# Patient Record
Sex: Female | Born: 1945 | Race: White | Hispanic: No | Smoking: Former smoker
Health system: Southern US, Community
[De-identification: ages and names within clinical notes are randomized; demographics above are authoritative.]

## PROBLEM LIST (undated history)

## (undated) DIAGNOSIS — Z923 Personal history of irradiation: Secondary | ICD-10-CM

## (undated) HISTORY — PX: PARATHYROIDECTOMY: SHX19

---

## 2005-09-24 DIAGNOSIS — C50919 Malignant neoplasm of unspecified site of unspecified female breast: Secondary | ICD-10-CM

## 2005-09-24 HISTORY — PX: BREAST LUMPECTOMY: SHX2

## 2005-09-24 HISTORY — DX: Malignant neoplasm of unspecified site of unspecified female breast: C50.919

## 2006-09-24 DIAGNOSIS — Z923 Personal history of irradiation: Secondary | ICD-10-CM

## 2006-09-24 HISTORY — DX: Personal history of irradiation: Z92.3

## 2011-09-28 DIAGNOSIS — D485 Neoplasm of uncertain behavior of skin: Secondary | ICD-10-CM | POA: Diagnosis not present

## 2011-09-28 DIAGNOSIS — C44319 Basal cell carcinoma of skin of other parts of face: Secondary | ICD-10-CM | POA: Diagnosis not present

## 2011-10-05 DIAGNOSIS — Z23 Encounter for immunization: Secondary | ICD-10-CM | POA: Diagnosis not present

## 2011-10-05 DIAGNOSIS — L989 Disorder of the skin and subcutaneous tissue, unspecified: Secondary | ICD-10-CM | POA: Diagnosis not present

## 2011-10-05 DIAGNOSIS — I10 Essential (primary) hypertension: Secondary | ICD-10-CM | POA: Diagnosis not present

## 2011-10-09 DIAGNOSIS — C44319 Basal cell carcinoma of skin of other parts of face: Secondary | ICD-10-CM | POA: Diagnosis not present

## 2011-10-09 DIAGNOSIS — I10 Essential (primary) hypertension: Secondary | ICD-10-CM | POA: Diagnosis not present

## 2011-10-09 DIAGNOSIS — R6889 Other general symptoms and signs: Secondary | ICD-10-CM | POA: Diagnosis not present

## 2011-11-07 DIAGNOSIS — L570 Actinic keratosis: Secondary | ICD-10-CM | POA: Diagnosis not present

## 2011-11-13 DIAGNOSIS — C44319 Basal cell carcinoma of skin of other parts of face: Secondary | ICD-10-CM | POA: Diagnosis not present

## 2011-12-14 DIAGNOSIS — C50419 Malignant neoplasm of upper-outer quadrant of unspecified female breast: Secondary | ICD-10-CM | POA: Diagnosis not present

## 2012-01-08 DIAGNOSIS — L509 Urticaria, unspecified: Secondary | ICD-10-CM | POA: Diagnosis not present

## 2012-01-08 DIAGNOSIS — I10 Essential (primary) hypertension: Secondary | ICD-10-CM | POA: Diagnosis not present

## 2012-03-05 DIAGNOSIS — L910 Hypertrophic scar: Secondary | ICD-10-CM | POA: Diagnosis not present

## 2012-04-09 DIAGNOSIS — C50419 Malignant neoplasm of upper-outer quadrant of unspecified female breast: Secondary | ICD-10-CM | POA: Diagnosis not present

## 2012-05-12 DIAGNOSIS — L570 Actinic keratosis: Secondary | ICD-10-CM | POA: Diagnosis not present

## 2012-05-12 DIAGNOSIS — B079 Viral wart, unspecified: Secondary | ICD-10-CM | POA: Diagnosis not present

## 2012-06-10 DIAGNOSIS — R922 Inconclusive mammogram: Secondary | ICD-10-CM | POA: Diagnosis not present

## 2012-06-10 DIAGNOSIS — Z9889 Other specified postprocedural states: Secondary | ICD-10-CM | POA: Diagnosis not present

## 2012-06-10 DIAGNOSIS — Z853 Personal history of malignant neoplasm of breast: Secondary | ICD-10-CM | POA: Diagnosis not present

## 2012-07-09 DIAGNOSIS — Z719 Counseling, unspecified: Secondary | ICD-10-CM | POA: Diagnosis not present

## 2012-07-09 DIAGNOSIS — I10 Essential (primary) hypertension: Secondary | ICD-10-CM | POA: Diagnosis not present

## 2012-07-09 DIAGNOSIS — Z23 Encounter for immunization: Secondary | ICD-10-CM | POA: Diagnosis not present

## 2012-10-13 DIAGNOSIS — D0590 Unspecified type of carcinoma in situ of unspecified breast: Secondary | ICD-10-CM | POA: Diagnosis not present

## 2013-01-22 DIAGNOSIS — D1800 Hemangioma unspecified site: Secondary | ICD-10-CM | POA: Diagnosis not present

## 2013-02-03 DIAGNOSIS — I10 Essential (primary) hypertension: Secondary | ICD-10-CM | POA: Diagnosis not present

## 2013-05-14 ENCOUNTER — Other Ambulatory Visit: Payer: Self-pay | Admitting: Family Medicine

## 2013-05-14 DIAGNOSIS — Z124 Encounter for screening for malignant neoplasm of cervix: Secondary | ICD-10-CM | POA: Diagnosis not present

## 2013-05-14 DIAGNOSIS — Z1331 Encounter for screening for depression: Secondary | ICD-10-CM | POA: Diagnosis not present

## 2013-05-14 DIAGNOSIS — N649 Disorder of breast, unspecified: Secondary | ICD-10-CM | POA: Diagnosis not present

## 2013-05-14 DIAGNOSIS — Z0000 Encounter for general adult medical examination without abnormal findings: Secondary | ICD-10-CM | POA: Diagnosis not present

## 2013-05-14 DIAGNOSIS — N6312 Unspecified lump in the right breast, upper inner quadrant: Secondary | ICD-10-CM

## 2013-05-14 DIAGNOSIS — I10 Essential (primary) hypertension: Secondary | ICD-10-CM | POA: Diagnosis not present

## 2013-05-14 DIAGNOSIS — Z136 Encounter for screening for cardiovascular disorders: Secondary | ICD-10-CM | POA: Diagnosis not present

## 2013-05-14 DIAGNOSIS — Z1211 Encounter for screening for malignant neoplasm of colon: Secondary | ICD-10-CM | POA: Diagnosis not present

## 2013-06-01 ENCOUNTER — Ambulatory Visit
Admission: RE | Admit: 2013-06-01 | Discharge: 2013-06-01 | Disposition: A | Discharge status: Home / Self Care | Payer: Medicare Other | Source: Ambulatory Visit | Attending: Family Medicine | Admitting: Family Medicine

## 2013-06-01 ENCOUNTER — Ambulatory Visit
Admission: RE | Admit: 2013-06-01 | Discharge: 2013-06-01 | Disposition: A | Discharge status: Home / Self Care | Payer: Self-pay | Source: Ambulatory Visit | Attending: Family Medicine | Admitting: Family Medicine

## 2013-06-01 DIAGNOSIS — N6312 Unspecified lump in the right breast, upper inner quadrant: Secondary | ICD-10-CM

## 2013-06-01 DIAGNOSIS — Z853 Personal history of malignant neoplasm of breast: Secondary | ICD-10-CM | POA: Diagnosis not present

## 2013-06-03 DIAGNOSIS — L821 Other seborrheic keratosis: Secondary | ICD-10-CM | POA: Diagnosis not present

## 2013-06-03 DIAGNOSIS — Z85828 Personal history of other malignant neoplasm of skin: Secondary | ICD-10-CM | POA: Diagnosis not present

## 2013-06-03 DIAGNOSIS — D485 Neoplasm of uncertain behavior of skin: Secondary | ICD-10-CM | POA: Diagnosis not present

## 2013-06-03 DIAGNOSIS — L819 Disorder of pigmentation, unspecified: Secondary | ICD-10-CM | POA: Diagnosis not present

## 2013-06-03 DIAGNOSIS — D1801 Hemangioma of skin and subcutaneous tissue: Secondary | ICD-10-CM | POA: Diagnosis not present

## 2013-06-03 DIAGNOSIS — D239 Other benign neoplasm of skin, unspecified: Secondary | ICD-10-CM | POA: Diagnosis not present

## 2013-06-03 DIAGNOSIS — L570 Actinic keratosis: Secondary | ICD-10-CM | POA: Diagnosis not present

## 2013-06-23 DIAGNOSIS — L578 Other skin changes due to chronic exposure to nonionizing radiation: Secondary | ICD-10-CM | POA: Diagnosis not present

## 2013-06-23 DIAGNOSIS — L570 Actinic keratosis: Secondary | ICD-10-CM | POA: Diagnosis not present

## 2013-08-06 DIAGNOSIS — K5730 Diverticulosis of large intestine without perforation or abscess without bleeding: Secondary | ICD-10-CM | POA: Diagnosis not present

## 2013-08-06 DIAGNOSIS — Z09 Encounter for follow-up examination after completed treatment for conditions other than malignant neoplasm: Secondary | ICD-10-CM | POA: Diagnosis not present

## 2013-08-06 DIAGNOSIS — Z86010 Personal history of colonic polyps: Secondary | ICD-10-CM | POA: Diagnosis not present

## 2013-11-05 DIAGNOSIS — L989 Disorder of the skin and subcutaneous tissue, unspecified: Secondary | ICD-10-CM | POA: Diagnosis not present

## 2013-11-05 DIAGNOSIS — I10 Essential (primary) hypertension: Secondary | ICD-10-CM | POA: Diagnosis not present

## 2013-11-12 DIAGNOSIS — L738 Other specified follicular disorders: Secondary | ICD-10-CM | POA: Diagnosis not present

## 2013-11-12 DIAGNOSIS — L570 Actinic keratosis: Secondary | ICD-10-CM | POA: Diagnosis not present

## 2013-11-12 DIAGNOSIS — D485 Neoplasm of uncertain behavior of skin: Secondary | ICD-10-CM | POA: Diagnosis not present

## 2013-11-13 DIAGNOSIS — L821 Other seborrheic keratosis: Secondary | ICD-10-CM | POA: Diagnosis not present

## 2013-12-03 DIAGNOSIS — R7309 Other abnormal glucose: Secondary | ICD-10-CM | POA: Diagnosis not present

## 2014-04-26 ENCOUNTER — Other Ambulatory Visit: Payer: Self-pay

## 2014-04-26 DIAGNOSIS — Z1231 Encounter for screening mammogram for malignant neoplasm of breast: Secondary | ICD-10-CM

## 2014-05-04 DIAGNOSIS — J069 Acute upper respiratory infection, unspecified: Secondary | ICD-10-CM | POA: Diagnosis not present

## 2014-05-04 DIAGNOSIS — K219 Gastro-esophageal reflux disease without esophagitis: Secondary | ICD-10-CM | POA: Diagnosis not present

## 2014-06-16 ENCOUNTER — Ambulatory Visit
Admission: RE | Admit: 2014-06-16 | Discharge: 2014-06-16 | Disposition: A | Discharge status: Home / Self Care | Payer: Medicare Other | Source: Ambulatory Visit

## 2014-06-16 DIAGNOSIS — Z23 Encounter for immunization: Secondary | ICD-10-CM | POA: Diagnosis not present

## 2014-06-16 DIAGNOSIS — I10 Essential (primary) hypertension: Secondary | ICD-10-CM | POA: Diagnosis not present

## 2014-06-16 DIAGNOSIS — Z1231 Encounter for screening mammogram for malignant neoplasm of breast: Secondary | ICD-10-CM | POA: Diagnosis not present

## 2014-06-16 DIAGNOSIS — Z0000 Encounter for general adult medical examination without abnormal findings: Secondary | ICD-10-CM | POA: Diagnosis not present

## 2014-06-16 DIAGNOSIS — Z136 Encounter for screening for cardiovascular disorders: Secondary | ICD-10-CM | POA: Diagnosis not present

## 2014-06-16 DIAGNOSIS — K219 Gastro-esophageal reflux disease without esophagitis: Secondary | ICD-10-CM | POA: Diagnosis not present

## 2014-06-16 DIAGNOSIS — Z1331 Encounter for screening for depression: Secondary | ICD-10-CM | POA: Diagnosis not present

## 2014-06-17 DIAGNOSIS — D239 Other benign neoplasm of skin, unspecified: Secondary | ICD-10-CM | POA: Diagnosis not present

## 2014-06-17 DIAGNOSIS — L819 Disorder of pigmentation, unspecified: Secondary | ICD-10-CM | POA: Diagnosis not present

## 2014-06-17 DIAGNOSIS — D485 Neoplasm of uncertain behavior of skin: Secondary | ICD-10-CM | POA: Diagnosis not present

## 2014-06-17 DIAGNOSIS — Z85828 Personal history of other malignant neoplasm of skin: Secondary | ICD-10-CM | POA: Diagnosis not present

## 2014-06-17 DIAGNOSIS — L821 Other seborrheic keratosis: Secondary | ICD-10-CM | POA: Diagnosis not present

## 2014-06-17 DIAGNOSIS — D1801 Hemangioma of skin and subcutaneous tissue: Secondary | ICD-10-CM | POA: Diagnosis not present

## 2014-06-18 DIAGNOSIS — L570 Actinic keratosis: Secondary | ICD-10-CM | POA: Diagnosis not present

## 2014-07-15 DIAGNOSIS — R7309 Other abnormal glucose: Secondary | ICD-10-CM | POA: Diagnosis not present

## 2014-08-24 DIAGNOSIS — L570 Actinic keratosis: Secondary | ICD-10-CM | POA: Diagnosis not present

## 2014-08-24 DIAGNOSIS — L821 Other seborrheic keratosis: Secondary | ICD-10-CM | POA: Diagnosis not present

## 2014-08-28 DIAGNOSIS — J029 Acute pharyngitis, unspecified: Secondary | ICD-10-CM | POA: Diagnosis not present

## 2014-10-29 DIAGNOSIS — L820 Inflamed seborrheic keratosis: Secondary | ICD-10-CM | POA: Diagnosis not present

## 2014-11-03 DIAGNOSIS — H903 Sensorineural hearing loss, bilateral: Secondary | ICD-10-CM | POA: Diagnosis not present

## 2014-12-15 DIAGNOSIS — Z85828 Personal history of other malignant neoplasm of skin: Secondary | ICD-10-CM | POA: Diagnosis not present

## 2014-12-15 DIAGNOSIS — Z8589 Personal history of malignant neoplasm of other organs and systems: Secondary | ICD-10-CM | POA: Diagnosis not present

## 2014-12-15 DIAGNOSIS — I10 Essential (primary) hypertension: Secondary | ICD-10-CM | POA: Diagnosis not present

## 2014-12-15 DIAGNOSIS — R7309 Other abnormal glucose: Secondary | ICD-10-CM | POA: Diagnosis not present

## 2015-03-20 DIAGNOSIS — L089 Local infection of the skin and subcutaneous tissue, unspecified: Secondary | ICD-10-CM | POA: Diagnosis not present

## 2015-03-20 DIAGNOSIS — S30860A Insect bite (nonvenomous) of lower back and pelvis, initial encounter: Secondary | ICD-10-CM | POA: Diagnosis not present

## 2015-05-18 ENCOUNTER — Other Ambulatory Visit: Payer: Self-pay

## 2015-05-18 DIAGNOSIS — Z1231 Encounter for screening mammogram for malignant neoplasm of breast: Secondary | ICD-10-CM

## 2015-06-27 ENCOUNTER — Ambulatory Visit
Admission: RE | Admit: 2015-06-27 | Discharge: 2015-06-27 | Disposition: A | Discharge status: Home / Self Care | Payer: Medicare Other | Source: Ambulatory Visit

## 2015-06-27 DIAGNOSIS — M84376A Stress fracture, unspecified foot, initial encounter for fracture: Secondary | ICD-10-CM | POA: Diagnosis not present

## 2015-06-27 DIAGNOSIS — M8580 Other specified disorders of bone density and structure, unspecified site: Secondary | ICD-10-CM | POA: Diagnosis not present

## 2015-06-27 DIAGNOSIS — Z1231 Encounter for screening mammogram for malignant neoplasm of breast: Secondary | ICD-10-CM | POA: Diagnosis not present

## 2015-06-30 ENCOUNTER — Other Ambulatory Visit: Payer: Self-pay | Admitting: Orthopedic Surgery

## 2015-06-30 DIAGNOSIS — M810 Age-related osteoporosis without current pathological fracture: Secondary | ICD-10-CM | POA: Diagnosis not present

## 2015-07-11 DIAGNOSIS — M8580 Other specified disorders of bone density and structure, unspecified site: Secondary | ICD-10-CM | POA: Diagnosis not present

## 2015-07-11 DIAGNOSIS — M79672 Pain in left foot: Secondary | ICD-10-CM | POA: Diagnosis not present

## 2015-07-11 DIAGNOSIS — M84376A Stress fracture, unspecified foot, initial encounter for fracture: Secondary | ICD-10-CM | POA: Diagnosis not present

## 2015-07-27 DIAGNOSIS — M8580 Other specified disorders of bone density and structure, unspecified site: Secondary | ICD-10-CM | POA: Diagnosis not present

## 2015-07-27 DIAGNOSIS — M84376A Stress fracture, unspecified foot, initial encounter for fracture: Secondary | ICD-10-CM | POA: Diagnosis not present

## 2015-08-03 ENCOUNTER — Ambulatory Visit
Admission: RE | Admit: 2015-08-03 | Discharge: 2015-08-03 | Disposition: A | Discharge status: Home / Self Care | Payer: Medicare Other | Source: Ambulatory Visit | Attending: Orthopedic Surgery | Admitting: Orthopedic Surgery

## 2015-08-03 DIAGNOSIS — M8588 Other specified disorders of bone density and structure, other site: Secondary | ICD-10-CM | POA: Diagnosis not present

## 2015-08-03 DIAGNOSIS — M810 Age-related osteoporosis without current pathological fracture: Secondary | ICD-10-CM

## 2015-08-09 DIAGNOSIS — R7309 Other abnormal glucose: Secondary | ICD-10-CM | POA: Diagnosis not present

## 2015-08-09 DIAGNOSIS — Z0000 Encounter for general adult medical examination without abnormal findings: Secondary | ICD-10-CM | POA: Diagnosis not present

## 2015-08-09 DIAGNOSIS — I10 Essential (primary) hypertension: Secondary | ICD-10-CM | POA: Diagnosis not present

## 2015-08-09 DIAGNOSIS — Z23 Encounter for immunization: Secondary | ICD-10-CM | POA: Diagnosis not present

## 2015-08-09 DIAGNOSIS — E7800 Pure hypercholesterolemia, unspecified: Secondary | ICD-10-CM | POA: Diagnosis not present

## 2015-08-09 DIAGNOSIS — K219 Gastro-esophageal reflux disease without esophagitis: Secondary | ICD-10-CM | POA: Diagnosis not present

## 2015-08-09 DIAGNOSIS — Z1389 Encounter for screening for other disorder: Secondary | ICD-10-CM | POA: Diagnosis not present

## 2015-08-09 DIAGNOSIS — Z853 Personal history of malignant neoplasm of breast: Secondary | ICD-10-CM | POA: Diagnosis not present

## 2015-08-10 DIAGNOSIS — M8580 Other specified disorders of bone density and structure, unspecified site: Secondary | ICD-10-CM | POA: Diagnosis not present

## 2015-08-10 DIAGNOSIS — M84376A Stress fracture, unspecified foot, initial encounter for fracture: Secondary | ICD-10-CM | POA: Diagnosis not present

## 2015-09-13 DIAGNOSIS — L570 Actinic keratosis: Secondary | ICD-10-CM | POA: Diagnosis not present

## 2015-09-13 DIAGNOSIS — D225 Melanocytic nevi of trunk: Secondary | ICD-10-CM | POA: Diagnosis not present

## 2015-09-13 DIAGNOSIS — L821 Other seborrheic keratosis: Secondary | ICD-10-CM | POA: Diagnosis not present

## 2015-09-13 DIAGNOSIS — L918 Other hypertrophic disorders of the skin: Secondary | ICD-10-CM | POA: Diagnosis not present

## 2015-09-13 DIAGNOSIS — L814 Other melanin hyperpigmentation: Secondary | ICD-10-CM | POA: Diagnosis not present

## 2015-09-13 DIAGNOSIS — Z85828 Personal history of other malignant neoplasm of skin: Secondary | ICD-10-CM | POA: Diagnosis not present

## 2015-09-13 DIAGNOSIS — D485 Neoplasm of uncertain behavior of skin: Secondary | ICD-10-CM | POA: Diagnosis not present

## 2015-09-13 DIAGNOSIS — D1801 Hemangioma of skin and subcutaneous tissue: Secondary | ICD-10-CM | POA: Diagnosis not present

## 2015-10-18 DIAGNOSIS — Z85828 Personal history of other malignant neoplasm of skin: Secondary | ICD-10-CM | POA: Diagnosis not present

## 2015-10-18 DIAGNOSIS — L570 Actinic keratosis: Secondary | ICD-10-CM | POA: Diagnosis not present

## 2015-10-18 DIAGNOSIS — D225 Melanocytic nevi of trunk: Secondary | ICD-10-CM | POA: Diagnosis not present

## 2015-10-18 DIAGNOSIS — D485 Neoplasm of uncertain behavior of skin: Secondary | ICD-10-CM | POA: Diagnosis not present

## 2015-11-30 DIAGNOSIS — M8580 Other specified disorders of bone density and structure, unspecified site: Secondary | ICD-10-CM | POA: Diagnosis not present

## 2015-11-30 DIAGNOSIS — E559 Vitamin D deficiency, unspecified: Secondary | ICD-10-CM | POA: Diagnosis not present

## 2015-11-30 DIAGNOSIS — I10 Essential (primary) hypertension: Secondary | ICD-10-CM | POA: Diagnosis not present

## 2015-11-30 DIAGNOSIS — R7309 Other abnormal glucose: Secondary | ICD-10-CM | POA: Diagnosis not present

## 2015-11-30 DIAGNOSIS — R7303 Prediabetes: Secondary | ICD-10-CM | POA: Diagnosis not present

## 2016-01-17 DIAGNOSIS — Z139 Encounter for screening, unspecified: Secondary | ICD-10-CM

## 2016-02-01 NOTE — Congregational Nurse Program (Signed)
Congregational Nurse Program Note  Date of Encounter: 01/17/2016  Past Medical History: No past medical history on file.  Encounter Details:     CNP Questionnaire - 01/17/16 1200    Patient Demographics   Is this a new or existing patient? Existing   Patient is considered a/an Not Applicable   Race Caucasian/White   Patient Assistance   Location of Patient Assistance Not Applicable   Patient's financial/insurance status Medicare   Uninsured Patient No   Patient referred to apply for the following financial assistance Not Applicable   Food insecurities addressed Not Applicable   Transportation assistance No   Assistance securing medications No   Educational health offerings Other   Encounter Details   Primary purpose of visit Chronic Illness/Condition Visit   Was an Emergency Department visit averted? Not Applicable   Does patient have a medical provider? Yes   Patient referred to Not Applicable   Was a mental health screening completed? (GAINS tool) No   Does patient have dental issues? No   Does patient have vision issues? No   Does your patient have an abnormal blood pressure today? No   Since previous encounter, have you referred patient for abnormal blood pressure that resulted in a new diagnosis or medication change? No   Does your patient have an abnormal blood glucose today? No   Since previous encounter, have you referred patient for abnormal blood glucose that resulted in a new diagnosis or medication change? No   Was there a life-saving intervention made? No     patient requested BP screening, reports she is being followed by her doctor, BP 120/62 mmHg  Pulse 72, no further action needed at this time.  Hali Marry RN, CNP # [phone].

## 2016-03-24 DIAGNOSIS — L259 Unspecified contact dermatitis, unspecified cause: Secondary | ICD-10-CM | POA: Diagnosis not present

## 2016-04-17 DIAGNOSIS — B029 Zoster without complications: Secondary | ICD-10-CM | POA: Diagnosis not present

## 2016-04-17 DIAGNOSIS — R21 Rash and other nonspecific skin eruption: Secondary | ICD-10-CM | POA: Diagnosis not present

## 2016-04-17 DIAGNOSIS — J3489 Other specified disorders of nose and nasal sinuses: Secondary | ICD-10-CM | POA: Diagnosis not present

## 2016-04-19 DIAGNOSIS — D485 Neoplasm of uncertain behavior of skin: Secondary | ICD-10-CM | POA: Diagnosis not present

## 2016-04-19 DIAGNOSIS — C44311 Basal cell carcinoma of skin of nose: Secondary | ICD-10-CM | POA: Diagnosis not present

## 2016-04-19 DIAGNOSIS — Z85828 Personal history of other malignant neoplasm of skin: Secondary | ICD-10-CM | POA: Diagnosis not present

## 2016-04-19 DIAGNOSIS — L309 Dermatitis, unspecified: Secondary | ICD-10-CM | POA: Diagnosis not present

## 2016-05-23 ENCOUNTER — Other Ambulatory Visit: Payer: Self-pay | Admitting: Family Medicine

## 2016-05-23 DIAGNOSIS — Z1231 Encounter for screening mammogram for malignant neoplasm of breast: Secondary | ICD-10-CM

## 2016-05-29 DIAGNOSIS — M542 Cervicalgia: Secondary | ICD-10-CM | POA: Diagnosis not present

## 2016-06-06 DIAGNOSIS — H2513 Age-related nuclear cataract, bilateral: Secondary | ICD-10-CM | POA: Diagnosis not present

## 2016-06-06 DIAGNOSIS — H524 Presbyopia: Secondary | ICD-10-CM | POA: Diagnosis not present

## 2016-06-06 DIAGNOSIS — H02834 Dermatochalasis of left upper eyelid: Secondary | ICD-10-CM | POA: Diagnosis not present

## 2016-06-06 DIAGNOSIS — H02831 Dermatochalasis of right upper eyelid: Secondary | ICD-10-CM | POA: Diagnosis not present

## 2016-06-27 ENCOUNTER — Ambulatory Visit
Admission: RE | Admit: 2016-06-27 | Discharge: 2016-06-27 | Disposition: A | Discharge status: Home / Self Care | Payer: Medicare Other | Source: Ambulatory Visit | Attending: Family Medicine | Admitting: Family Medicine

## 2016-06-27 DIAGNOSIS — Z1231 Encounter for screening mammogram for malignant neoplasm of breast: Secondary | ICD-10-CM

## 2016-07-02 DIAGNOSIS — Z85828 Personal history of other malignant neoplasm of skin: Secondary | ICD-10-CM | POA: Diagnosis not present

## 2016-07-02 DIAGNOSIS — C44311 Basal cell carcinoma of skin of nose: Secondary | ICD-10-CM | POA: Diagnosis not present

## 2016-07-29 DIAGNOSIS — R300 Dysuria: Secondary | ICD-10-CM | POA: Diagnosis not present

## 2016-07-29 DIAGNOSIS — R319 Hematuria, unspecified: Secondary | ICD-10-CM | POA: Diagnosis not present

## 2016-07-29 DIAGNOSIS — R350 Frequency of micturition: Secondary | ICD-10-CM | POA: Diagnosis not present

## 2016-08-07 DIAGNOSIS — R350 Frequency of micturition: Secondary | ICD-10-CM | POA: Diagnosis not present

## 2016-08-07 DIAGNOSIS — R3121 Asymptomatic microscopic hematuria: Secondary | ICD-10-CM | POA: Diagnosis not present

## 2016-08-13 DIAGNOSIS — N200 Calculus of kidney: Secondary | ICD-10-CM | POA: Diagnosis not present

## 2016-08-13 DIAGNOSIS — R3121 Asymptomatic microscopic hematuria: Secondary | ICD-10-CM | POA: Diagnosis not present

## 2016-08-14 DIAGNOSIS — I10 Essential (primary) hypertension: Secondary | ICD-10-CM | POA: Diagnosis not present

## 2016-08-14 DIAGNOSIS — M8580 Other specified disorders of bone density and structure, unspecified site: Secondary | ICD-10-CM | POA: Diagnosis not present

## 2016-08-14 DIAGNOSIS — R7301 Impaired fasting glucose: Secondary | ICD-10-CM | POA: Diagnosis not present

## 2016-08-14 DIAGNOSIS — E7800 Pure hypercholesterolemia, unspecified: Secondary | ICD-10-CM | POA: Diagnosis not present

## 2016-08-14 DIAGNOSIS — Z1389 Encounter for screening for other disorder: Secondary | ICD-10-CM | POA: Diagnosis not present

## 2016-08-14 DIAGNOSIS — Z23 Encounter for immunization: Secondary | ICD-10-CM | POA: Diagnosis not present

## 2016-08-14 DIAGNOSIS — Z0000 Encounter for general adult medical examination without abnormal findings: Secondary | ICD-10-CM | POA: Diagnosis not present

## 2016-08-21 DIAGNOSIS — N200 Calculus of kidney: Secondary | ICD-10-CM | POA: Diagnosis not present

## 2016-08-21 DIAGNOSIS — R3121 Asymptomatic microscopic hematuria: Secondary | ICD-10-CM | POA: Diagnosis not present

## 2016-11-19 DIAGNOSIS — L821 Other seborrheic keratosis: Secondary | ICD-10-CM | POA: Diagnosis not present

## 2016-11-19 DIAGNOSIS — D225 Melanocytic nevi of trunk: Secondary | ICD-10-CM | POA: Diagnosis not present

## 2016-11-19 DIAGNOSIS — Z85828 Personal history of other malignant neoplasm of skin: Secondary | ICD-10-CM | POA: Diagnosis not present

## 2016-11-19 DIAGNOSIS — L720 Epidermal cyst: Secondary | ICD-10-CM | POA: Diagnosis not present

## 2016-11-19 DIAGNOSIS — L2089 Other atopic dermatitis: Secondary | ICD-10-CM | POA: Diagnosis not present

## 2016-11-19 DIAGNOSIS — L570 Actinic keratosis: Secondary | ICD-10-CM | POA: Diagnosis not present

## 2016-11-19 DIAGNOSIS — D2272 Melanocytic nevi of left lower limb, including hip: Secondary | ICD-10-CM | POA: Diagnosis not present

## 2016-11-30 DIAGNOSIS — Z85828 Personal history of other malignant neoplasm of skin: Secondary | ICD-10-CM | POA: Diagnosis not present

## 2016-11-30 DIAGNOSIS — L570 Actinic keratosis: Secondary | ICD-10-CM | POA: Diagnosis not present

## 2017-02-19 DIAGNOSIS — H9313 Tinnitus, bilateral: Secondary | ICD-10-CM | POA: Diagnosis not present

## 2017-02-19 DIAGNOSIS — E7800 Pure hypercholesterolemia, unspecified: Secondary | ICD-10-CM | POA: Diagnosis not present

## 2017-02-19 DIAGNOSIS — I10 Essential (primary) hypertension: Secondary | ICD-10-CM | POA: Diagnosis not present

## 2017-02-19 DIAGNOSIS — Z1159 Encounter for screening for other viral diseases: Secondary | ICD-10-CM | POA: Diagnosis not present

## 2017-02-19 DIAGNOSIS — R7303 Prediabetes: Secondary | ICD-10-CM | POA: Diagnosis not present

## 2017-03-26 DIAGNOSIS — L570 Actinic keratosis: Secondary | ICD-10-CM | POA: Diagnosis not present

## 2017-03-26 DIAGNOSIS — Z85828 Personal history of other malignant neoplasm of skin: Secondary | ICD-10-CM | POA: Diagnosis not present

## 2017-03-26 DIAGNOSIS — M549 Dorsalgia, unspecified: Secondary | ICD-10-CM | POA: Diagnosis not present

## 2017-03-26 DIAGNOSIS — L820 Inflamed seborrheic keratosis: Secondary | ICD-10-CM | POA: Diagnosis not present

## 2017-05-08 DIAGNOSIS — H9313 Tinnitus, bilateral: Secondary | ICD-10-CM | POA: Diagnosis not present

## 2017-05-08 DIAGNOSIS — H903 Sensorineural hearing loss, bilateral: Secondary | ICD-10-CM | POA: Diagnosis not present

## 2017-05-15 DIAGNOSIS — R200 Anesthesia of skin: Secondary | ICD-10-CM | POA: Diagnosis not present

## 2017-05-15 DIAGNOSIS — I10 Essential (primary) hypertension: Secondary | ICD-10-CM | POA: Diagnosis not present

## 2017-05-15 DIAGNOSIS — R202 Paresthesia of skin: Secondary | ICD-10-CM | POA: Diagnosis not present

## 2017-05-15 DIAGNOSIS — Z23 Encounter for immunization: Secondary | ICD-10-CM | POA: Diagnosis not present

## 2017-05-15 DIAGNOSIS — R203 Hyperesthesia: Secondary | ICD-10-CM | POA: Diagnosis not present

## 2017-05-15 DIAGNOSIS — Z1389 Encounter for screening for other disorder: Secondary | ICD-10-CM | POA: Diagnosis not present

## 2017-05-21 ENCOUNTER — Other Ambulatory Visit: Payer: Self-pay | Admitting: Family Medicine

## 2017-05-21 DIAGNOSIS — Z1231 Encounter for screening mammogram for malignant neoplasm of breast: Secondary | ICD-10-CM

## 2017-06-05 DIAGNOSIS — H2513 Age-related nuclear cataract, bilateral: Secondary | ICD-10-CM | POA: Diagnosis not present

## 2017-06-05 DIAGNOSIS — H5203 Hypermetropia, bilateral: Secondary | ICD-10-CM | POA: Diagnosis not present

## 2017-06-22 DIAGNOSIS — R21 Rash and other nonspecific skin eruption: Secondary | ICD-10-CM | POA: Diagnosis not present

## 2017-07-17 ENCOUNTER — Ambulatory Visit
Admission: RE | Admit: 2017-07-17 | Discharge: 2017-07-17 | Disposition: A | Discharge status: Home / Self Care | Payer: Medicare Other | Source: Ambulatory Visit | Attending: Family Medicine | Admitting: Family Medicine

## 2017-07-17 ENCOUNTER — Encounter (INDEPENDENT_AMBULATORY_CARE_PROVIDER_SITE_OTHER): Payer: Self-pay

## 2017-07-17 DIAGNOSIS — Z1231 Encounter for screening mammogram for malignant neoplasm of breast: Secondary | ICD-10-CM | POA: Diagnosis not present

## 2017-07-17 HISTORY — DX: Personal history of irradiation: Z92.3

## 2017-08-12 DIAGNOSIS — L820 Inflamed seborrheic keratosis: Secondary | ICD-10-CM | POA: Diagnosis not present

## 2017-08-12 DIAGNOSIS — B078 Other viral warts: Secondary | ICD-10-CM | POA: Diagnosis not present

## 2017-08-12 DIAGNOSIS — D485 Neoplasm of uncertain behavior of skin: Secondary | ICD-10-CM | POA: Diagnosis not present

## 2017-08-12 DIAGNOSIS — D3611 Benign neoplasm of peripheral nerves and autonomic nervous system of face, head, and neck: Secondary | ICD-10-CM | POA: Diagnosis not present

## 2017-08-12 DIAGNOSIS — L570 Actinic keratosis: Secondary | ICD-10-CM | POA: Diagnosis not present

## 2017-08-12 DIAGNOSIS — Z85828 Personal history of other malignant neoplasm of skin: Secondary | ICD-10-CM | POA: Diagnosis not present

## 2017-08-20 ENCOUNTER — Other Ambulatory Visit: Payer: Self-pay | Admitting: Family Medicine

## 2017-08-20 DIAGNOSIS — E7800 Pure hypercholesterolemia, unspecified: Secondary | ICD-10-CM | POA: Diagnosis not present

## 2017-08-20 DIAGNOSIS — Z0001 Encounter for general adult medical examination with abnormal findings: Secondary | ICD-10-CM | POA: Diagnosis not present

## 2017-08-20 DIAGNOSIS — M8580 Other specified disorders of bone density and structure, unspecified site: Secondary | ICD-10-CM | POA: Diagnosis not present

## 2017-08-20 DIAGNOSIS — R7303 Prediabetes: Secondary | ICD-10-CM | POA: Diagnosis not present

## 2017-08-20 DIAGNOSIS — M85851 Other specified disorders of bone density and structure, right thigh: Secondary | ICD-10-CM | POA: Diagnosis not present

## 2017-08-20 DIAGNOSIS — R222 Localized swelling, mass and lump, trunk: Secondary | ICD-10-CM

## 2017-08-20 DIAGNOSIS — I10 Essential (primary) hypertension: Secondary | ICD-10-CM | POA: Diagnosis not present

## 2017-08-20 DIAGNOSIS — Z1389 Encounter for screening for other disorder: Secondary | ICD-10-CM | POA: Diagnosis not present

## 2017-08-22 DIAGNOSIS — E7849 Other hyperlipidemia: Secondary | ICD-10-CM | POA: Diagnosis not present

## 2017-08-22 DIAGNOSIS — K219 Gastro-esophageal reflux disease without esophagitis: Secondary | ICD-10-CM | POA: Diagnosis not present

## 2017-08-22 DIAGNOSIS — I10 Essential (primary) hypertension: Secondary | ICD-10-CM | POA: Diagnosis not present

## 2017-08-26 ENCOUNTER — Other Ambulatory Visit: Payer: Medicare Other

## 2017-08-26 ENCOUNTER — Ambulatory Visit
Admission: RE | Admit: 2017-08-26 | Discharge: 2017-08-26 | Disposition: A | Discharge status: Home / Self Care | Payer: Medicare Other | Source: Ambulatory Visit | Attending: Family Medicine | Admitting: Family Medicine

## 2017-08-26 DIAGNOSIS — I10 Essential (primary) hypertension: Secondary | ICD-10-CM | POA: Diagnosis not present

## 2017-08-26 DIAGNOSIS — R222 Localized swelling, mass and lump, trunk: Secondary | ICD-10-CM

## 2017-08-26 DIAGNOSIS — R0789 Other chest pain: Secondary | ICD-10-CM | POA: Diagnosis not present

## 2017-08-26 MED ORDER — IOPAMIDOL (ISOVUE-300) INJECTION 61%
75.0000 mL | Freq: Once | INTRAVENOUS | Status: AC | PRN
  Administered 2017-08-26: 75 mL via INTRAVENOUS

## 2017-09-23 DIAGNOSIS — E7849 Other hyperlipidemia: Secondary | ICD-10-CM | POA: Diagnosis not present

## 2017-09-23 DIAGNOSIS — I119 Hypertensive heart disease without heart failure: Secondary | ICD-10-CM | POA: Diagnosis not present

## 2017-09-23 DIAGNOSIS — K219 Gastro-esophageal reflux disease without esophagitis: Secondary | ICD-10-CM | POA: Diagnosis not present

## 2017-11-20 DIAGNOSIS — D225 Melanocytic nevi of trunk: Secondary | ICD-10-CM | POA: Diagnosis not present

## 2017-11-20 DIAGNOSIS — D2272 Melanocytic nevi of left lower limb, including hip: Secondary | ICD-10-CM | POA: Diagnosis not present

## 2017-11-20 DIAGNOSIS — Z85828 Personal history of other malignant neoplasm of skin: Secondary | ICD-10-CM | POA: Diagnosis not present

## 2017-11-20 DIAGNOSIS — L821 Other seborrheic keratosis: Secondary | ICD-10-CM | POA: Diagnosis not present

## 2017-11-20 DIAGNOSIS — L723 Sebaceous cyst: Secondary | ICD-10-CM | POA: Diagnosis not present

## 2017-11-20 DIAGNOSIS — D1801 Hemangioma of skin and subcutaneous tissue: Secondary | ICD-10-CM | POA: Diagnosis not present

## 2017-11-20 DIAGNOSIS — L245 Irritant contact dermatitis due to other chemical products: Secondary | ICD-10-CM | POA: Diagnosis not present

## 2017-11-20 DIAGNOSIS — I8311 Varicose veins of right lower extremity with inflammation: Secondary | ICD-10-CM | POA: Diagnosis not present

## 2017-11-20 DIAGNOSIS — L570 Actinic keratosis: Secondary | ICD-10-CM | POA: Diagnosis not present

## 2017-11-20 DIAGNOSIS — I8312 Varicose veins of left lower extremity with inflammation: Secondary | ICD-10-CM | POA: Diagnosis not present

## 2017-11-20 DIAGNOSIS — I872 Venous insufficiency (chronic) (peripheral): Secondary | ICD-10-CM | POA: Diagnosis not present

## 2017-11-20 DIAGNOSIS — L820 Inflamed seborrheic keratosis: Secondary | ICD-10-CM | POA: Diagnosis not present

## 2018-01-08 DIAGNOSIS — L821 Other seborrheic keratosis: Secondary | ICD-10-CM | POA: Diagnosis not present

## 2018-01-08 DIAGNOSIS — I8312 Varicose veins of left lower extremity with inflammation: Secondary | ICD-10-CM | POA: Diagnosis not present

## 2018-01-08 DIAGNOSIS — L304 Erythema intertrigo: Secondary | ICD-10-CM | POA: Diagnosis not present

## 2018-01-08 DIAGNOSIS — I872 Venous insufficiency (chronic) (peripheral): Secondary | ICD-10-CM | POA: Diagnosis not present

## 2018-01-08 DIAGNOSIS — I8311 Varicose veins of right lower extremity with inflammation: Secondary | ICD-10-CM | POA: Diagnosis not present

## 2018-01-08 DIAGNOSIS — Z85828 Personal history of other malignant neoplasm of skin: Secondary | ICD-10-CM | POA: Diagnosis not present

## 2018-02-25 DIAGNOSIS — I10 Essential (primary) hypertension: Secondary | ICD-10-CM | POA: Diagnosis not present

## 2018-02-25 DIAGNOSIS — R7303 Prediabetes: Secondary | ICD-10-CM | POA: Diagnosis not present

## 2018-02-25 DIAGNOSIS — E7800 Pure hypercholesterolemia, unspecified: Secondary | ICD-10-CM | POA: Diagnosis not present

## 2018-03-20 DIAGNOSIS — R21 Rash and other nonspecific skin eruption: Secondary | ICD-10-CM | POA: Diagnosis not present

## 2018-03-24 DIAGNOSIS — L282 Other prurigo: Secondary | ICD-10-CM | POA: Diagnosis not present

## 2018-03-24 DIAGNOSIS — I8312 Varicose veins of left lower extremity with inflammation: Secondary | ICD-10-CM | POA: Diagnosis not present

## 2018-03-24 DIAGNOSIS — Z85828 Personal history of other malignant neoplasm of skin: Secondary | ICD-10-CM | POA: Diagnosis not present

## 2018-03-24 DIAGNOSIS — L300 Nummular dermatitis: Secondary | ICD-10-CM | POA: Diagnosis not present

## 2018-03-24 DIAGNOSIS — I8311 Varicose veins of right lower extremity with inflammation: Secondary | ICD-10-CM | POA: Diagnosis not present

## 2018-03-24 DIAGNOSIS — I872 Venous insufficiency (chronic) (peripheral): Secondary | ICD-10-CM | POA: Diagnosis not present

## 2018-06-04 ENCOUNTER — Other Ambulatory Visit: Payer: Self-pay | Admitting: Family Medicine

## 2018-06-04 DIAGNOSIS — Z1231 Encounter for screening mammogram for malignant neoplasm of breast: Secondary | ICD-10-CM

## 2018-06-19 DIAGNOSIS — H5203 Hypermetropia, bilateral: Secondary | ICD-10-CM | POA: Diagnosis not present

## 2018-06-19 DIAGNOSIS — H2513 Age-related nuclear cataract, bilateral: Secondary | ICD-10-CM | POA: Diagnosis not present

## 2018-07-01 DIAGNOSIS — Z23 Encounter for immunization: Secondary | ICD-10-CM | POA: Diagnosis not present

## 2018-07-11 DIAGNOSIS — M7981 Nontraumatic hematoma of soft tissue: Secondary | ICD-10-CM | POA: Diagnosis not present

## 2018-07-30 ENCOUNTER — Ambulatory Visit
Admission: RE | Admit: 2018-07-30 | Discharge: 2018-07-30 | Disposition: A | Discharge status: Home / Self Care | Payer: Medicare Other | Source: Ambulatory Visit | Attending: Family Medicine | Admitting: Family Medicine

## 2018-07-30 DIAGNOSIS — Z1231 Encounter for screening mammogram for malignant neoplasm of breast: Secondary | ICD-10-CM

## 2018-09-02 DIAGNOSIS — Z1389 Encounter for screening for other disorder: Secondary | ICD-10-CM | POA: Diagnosis not present

## 2018-09-02 DIAGNOSIS — K219 Gastro-esophageal reflux disease without esophagitis: Secondary | ICD-10-CM | POA: Diagnosis not present

## 2018-09-02 DIAGNOSIS — R7303 Prediabetes: Secondary | ICD-10-CM | POA: Diagnosis not present

## 2018-09-02 DIAGNOSIS — Z0000 Encounter for general adult medical examination without abnormal findings: Secondary | ICD-10-CM | POA: Diagnosis not present

## 2018-09-02 DIAGNOSIS — I10 Essential (primary) hypertension: Secondary | ICD-10-CM | POA: Diagnosis not present

## 2018-09-02 DIAGNOSIS — E7800 Pure hypercholesterolemia, unspecified: Secondary | ICD-10-CM | POA: Diagnosis not present

## 2018-09-30 DIAGNOSIS — H25011 Cortical age-related cataract, right eye: Secondary | ICD-10-CM | POA: Diagnosis not present

## 2018-09-30 DIAGNOSIS — H25811 Combined forms of age-related cataract, right eye: Secondary | ICD-10-CM | POA: Diagnosis not present

## 2018-09-30 DIAGNOSIS — H2511 Age-related nuclear cataract, right eye: Secondary | ICD-10-CM | POA: Diagnosis not present

## 2018-10-21 DIAGNOSIS — H25812 Combined forms of age-related cataract, left eye: Secondary | ICD-10-CM | POA: Diagnosis not present

## 2018-10-21 DIAGNOSIS — H25012 Cortical age-related cataract, left eye: Secondary | ICD-10-CM | POA: Diagnosis not present

## 2018-10-21 DIAGNOSIS — H2512 Age-related nuclear cataract, left eye: Secondary | ICD-10-CM | POA: Diagnosis not present

## 2018-12-16 DIAGNOSIS — D225 Melanocytic nevi of trunk: Secondary | ICD-10-CM | POA: Diagnosis not present

## 2018-12-16 DIAGNOSIS — L245 Irritant contact dermatitis due to other chemical products: Secondary | ICD-10-CM | POA: Diagnosis not present

## 2018-12-16 DIAGNOSIS — L814 Other melanin hyperpigmentation: Secondary | ICD-10-CM | POA: Diagnosis not present

## 2018-12-16 DIAGNOSIS — L821 Other seborrheic keratosis: Secondary | ICD-10-CM | POA: Diagnosis not present

## 2018-12-16 DIAGNOSIS — D2272 Melanocytic nevi of left lower limb, including hip: Secondary | ICD-10-CM | POA: Diagnosis not present

## 2018-12-16 DIAGNOSIS — L738 Other specified follicular disorders: Secondary | ICD-10-CM | POA: Diagnosis not present

## 2018-12-16 DIAGNOSIS — L84 Corns and callosities: Secondary | ICD-10-CM | POA: Diagnosis not present

## 2018-12-16 DIAGNOSIS — L570 Actinic keratosis: Secondary | ICD-10-CM | POA: Diagnosis not present

## 2018-12-16 DIAGNOSIS — L720 Epidermal cyst: Secondary | ICD-10-CM | POA: Diagnosis not present

## 2018-12-16 DIAGNOSIS — Z85828 Personal history of other malignant neoplasm of skin: Secondary | ICD-10-CM | POA: Diagnosis not present

## 2019-03-04 DIAGNOSIS — I10 Essential (primary) hypertension: Secondary | ICD-10-CM | POA: Diagnosis not present

## 2019-03-04 DIAGNOSIS — R7303 Prediabetes: Secondary | ICD-10-CM | POA: Diagnosis not present

## 2019-03-18 DIAGNOSIS — Z20828 Contact with and (suspected) exposure to other viral communicable diseases: Secondary | ICD-10-CM | POA: Diagnosis not present

## 2019-03-19 DIAGNOSIS — D122 Benign neoplasm of ascending colon: Secondary | ICD-10-CM | POA: Diagnosis not present

## 2019-03-19 DIAGNOSIS — K635 Polyp of colon: Secondary | ICD-10-CM | POA: Diagnosis not present

## 2019-03-19 DIAGNOSIS — Z86010 Personal history of colonic polyps: Secondary | ICD-10-CM | POA: Diagnosis not present

## 2019-03-19 DIAGNOSIS — K5730 Diverticulosis of large intestine without perforation or abscess without bleeding: Secondary | ICD-10-CM | POA: Diagnosis not present

## 2019-03-20 ENCOUNTER — Encounter (HOSPITAL_COMMUNITY): Payer: Self-pay

## 2019-03-20 ENCOUNTER — Other Ambulatory Visit: Payer: Self-pay

## 2019-03-20 ENCOUNTER — Emergency Department (HOSPITAL_COMMUNITY)
Admission: EM | Admit: 2019-03-20 | Discharge: 2019-03-21 | Disposition: A | Discharge status: Home / Self Care | Payer: Medicare Other | Attending: Emergency Medicine | Admitting: Emergency Medicine

## 2019-03-20 DIAGNOSIS — R1084 Generalized abdominal pain: Secondary | ICD-10-CM | POA: Diagnosis present

## 2019-03-20 DIAGNOSIS — Z79899 Other long term (current) drug therapy: Secondary | ICD-10-CM | POA: Diagnosis not present

## 2019-03-20 DIAGNOSIS — K5900 Constipation, unspecified: Secondary | ICD-10-CM | POA: Diagnosis not present

## 2019-03-20 DIAGNOSIS — K5732 Diverticulitis of large intestine without perforation or abscess without bleeding: Secondary | ICD-10-CM | POA: Diagnosis not present

## 2019-03-20 DIAGNOSIS — R197 Diarrhea, unspecified: Secondary | ICD-10-CM | POA: Diagnosis not present

## 2019-03-20 DIAGNOSIS — Z7982 Long term (current) use of aspirin: Secondary | ICD-10-CM | POA: Diagnosis not present

## 2019-03-20 LAB — CBC
HCT: 42.7 % (ref 36.0–46.0)
Hemoglobin: 13.8 g/dL (ref 12.0–15.0)
MCH: 32.8 pg (ref 26.0–34.0)
MCHC: 32.3 g/dL (ref 30.0–36.0)
MCV: 101.4 fL — ABNORMAL HIGH (ref 80.0–100.0)
Platelets: 225 10*3/uL (ref 150–400)
RBC: 4.21 MIL/uL (ref 3.87–5.11)
RDW: 12.2 % (ref 11.5–15.5)
WBC: 9.7 10*3/uL (ref 4.0–10.5)
nRBC: 0 % (ref 0.0–0.2)

## 2019-03-20 MED ORDER — SODIUM CHLORIDE 0.9% FLUSH
3.0000 mL | Freq: Once | INTRAVENOUS | Status: AC
  Administered 2019-03-20: 3 mL via INTRAVENOUS

## 2019-03-20 NOTE — ED Triage Notes (Signed)
Pt reports abdominal pain and diarrhea starting about 330p. She had a colonoscopy yesterday. Denies vomiting. Denies bright red blood in her stools.

## 2019-03-21 ENCOUNTER — Encounter (HOSPITAL_COMMUNITY): Payer: Self-pay

## 2019-03-21 ENCOUNTER — Emergency Department (HOSPITAL_COMMUNITY): Payer: Medicare Other

## 2019-03-21 DIAGNOSIS — K5732 Diverticulitis of large intestine without perforation or abscess without bleeding: Secondary | ICD-10-CM | POA: Diagnosis not present

## 2019-03-21 DIAGNOSIS — R197 Diarrhea, unspecified: Secondary | ICD-10-CM | POA: Diagnosis not present

## 2019-03-21 LAB — COMPREHENSIVE METABOLIC PANEL
ALT: 37 U/L (ref 0–44)
AST: 52 U/L — ABNORMAL HIGH (ref 15–41)
Albumin: 3.8 g/dL (ref 3.5–5.0)
Alkaline Phosphatase: 73 U/L (ref 38–126)
Anion gap: 10 (ref 5–15)
BUN: 12 mg/dL (ref 8–23)
CO2: 23 mmol/L (ref 22–32)
Calcium: 9 mg/dL (ref 8.9–10.3)
Chloride: 107 mmol/L (ref 98–111)
Creatinine, Ser: 0.41 mg/dL — ABNORMAL LOW (ref 0.44–1.00)
GFR calc Af Amer: >60 mL/min (ref >60)
GFR calc non Af Amer: >60 mL/min (ref >60)
Glucose, Bld: 146 mg/dL — ABNORMAL HIGH (ref 70–99)
Potassium: 3.4 mmol/L — ABNORMAL LOW (ref 3.5–5.1)
Sodium: 140 mmol/L (ref 135–145)
Total Bilirubin: 0.5 mg/dL (ref 0.3–1.2)
Total Protein: 6.9 g/dL (ref 6.5–8.1)

## 2019-03-21 LAB — URINALYSIS, ROUTINE W REFLEX MICROSCOPIC
Bacteria, UA: NONE SEEN
Bilirubin Urine: NEGATIVE
Glucose, UA: NEGATIVE mg/dL
Hgb urine dipstick: NEGATIVE
Ketones, ur: NEGATIVE mg/dL
Nitrite: NEGATIVE
Protein, ur: NEGATIVE mg/dL
Specific Gravity, Urine: 1.011 (ref 1.005–1.030)
pH: 6 (ref 5.0–8.0)

## 2019-03-21 LAB — LIPASE, BLOOD: Lipase: 37 U/L (ref 11–51)

## 2019-03-21 LAB — POC OCCULT BLOOD, ED: Fecal Occult Bld: NEGATIVE

## 2019-03-21 MED ORDER — AMOXICILLIN-POT CLAVULANATE 875-125 MG PO TABS
1.0000 | ORAL_TABLET | Freq: Once | ORAL | Status: AC
  Administered 2019-03-21: 1 via ORAL
  Filled 2019-03-21: qty 1

## 2019-03-21 MED ORDER — ACETAMINOPHEN 500 MG PO TABS
1000.0000 mg | ORAL_TABLET | Freq: Once | ORAL | Status: AC
  Administered 2019-03-21: 1000 mg via ORAL
  Filled 2019-03-21: qty 2

## 2019-03-21 MED ORDER — SODIUM CHLORIDE (PF) 0.9 % IJ SOLN
INTRAMUSCULAR | Status: AC
  Filled 2019-03-21: qty 50

## 2019-03-21 MED ORDER — ALUM & MAG HYDROXIDE-SIMETH 200-200-20 MG/5ML PO SUSP
30.0000 mL | Freq: Once | ORAL | Status: AC
  Administered 2019-03-21: 30 mL via ORAL
  Filled 2019-03-21: qty 30

## 2019-03-21 MED ORDER — AMOXICILLIN-POT CLAVULANATE 875-125 MG PO TABS: 1.0000 | ORAL_TABLET | Freq: Two times a day (BID) | ORAL | 0 refills | Status: DC

## 2019-03-21 MED ORDER — KETOROLAC TROMETHAMINE 30 MG/ML IJ SOLN
15.0000 mg | Freq: Once | INTRAMUSCULAR | Status: AC
  Administered 2019-03-21: 15 mg via INTRAVENOUS
  Filled 2019-03-21: qty 1

## 2019-03-21 MED ORDER — IOHEXOL 300 MG/ML  SOLN
100.0000 mL | Freq: Once | INTRAMUSCULAR | Status: AC | PRN
  Administered 2019-03-21: 100 mL via INTRAVENOUS

## 2019-03-21 NOTE — ED Provider Notes (Signed)
[facility] DEPT Provider Note   CSN: [id number] Arrival date & time: 03/20/19  2234     History   Chief Complaint Chief Complaint  Patient presents with   Abdominal Pain    HPI Kim Moreno is a 73 y.o. female.     The history is provided by the patient.  Abdominal Pain Pain location:  Generalized Pain quality: aching   Pain radiates to:  Does not radiate Pain severity:  Moderate Onset quality:  Gradual Timing:  Constant Progression:  Unchanged Chronicity:  New Context: not medication withdrawal   Relieved by:  Nothing Worsened by:  Nothing Ineffective treatments:  None tried Associated symptoms: no anorexia, no chest pain, no constipation, no cough, no diarrhea, no dysuria, no fever, no melena, no nausea, no shortness of breath, no vaginal bleeding, no vaginal discharge and no vomiting   Risk factors: has not had multiple surgeries   Had colonoscopy yesterday and had pain today diffusely.  No f/c/r.  Sent in by GI.    Past Medical History:  Diagnosis Date   Personal history of radiation therapy 2008   left breast    There are no active problems to display for this patient.   Past Surgical History:  Procedure Laterality Date   BREAST LUMPECTOMY Left 2007     OB History   No obstetric history on file.      Home Medications    Prior to Admission medications   Medication Sig Start Date End Date Taking? Authorizing Provider  amLODipine (NORVASC) 10 MG tablet Take 10 mg by mouth daily.  12/05/18  Yes [provider]  aspirin 81 MG chewable tablet Chew 81 mg by mouth daily.   Yes [provider]  ergocalciferol (DRISDOL) 200 MCG/ML drops Take 4,000 Units by mouth daily.   Yes [provider]  Misc Natural Products (GREEN FOOD COMPLEX PO) Take 2 capsules by mouth daily.   Yes [provider]  olmesartan (BENICAR) 20 MG tablet Take 20 mg by mouth daily.  02/27/19  Yes [provider]  omega-3 acid ethyl esters (LOVAZA) 1 g capsule Take 2 g by mouth daily.   Yes [provider]    Family History History reviewed. No pertinent family history.  Social History Social History   Tobacco Use   Smoking status: Not on file  Substance Use Topics   Alcohol use: Not on file   Drug use: Not on file     Allergies   Arimidex [anastrozole] and Latex   Review of Systems Review of Systems  Constitutional: Negative for appetite change and fever.  Respiratory: Negative for cough and shortness of breath.   Cardiovascular: Negative for chest pain.  Gastrointestinal: Positive for abdominal pain. Negative for anorexia, constipation, diarrhea, melena, nausea and vomiting.  Genitourinary: Negative for dysuria, vaginal bleeding and vaginal discharge.  All other systems reviewed and are negative.    Physical Exam Updated Vital Signs BP 134/64 (BP Location: Right Arm)    Pulse 67    Temp 98.4 F (36.9 C) (Oral)    Resp 18    SpO2 99%   Physical Exam Vitals signs and nursing note reviewed.  Constitutional:      General: She is not in acute distress.    Appearance: She is normal weight.  HENT:     Head: Normocephalic and atraumatic.     Nose: Nose normal.  Eyes:     Conjunctiva/sclera: Conjunctivae normal.  Pupils: Pupils are equal, round, and reactive to light.  Neck:     Musculoskeletal: Normal range of motion and neck supple.  Cardiovascular:     Rate and Rhythm: Normal rate and regular rhythm.     Pulses: Normal pulses.     Heart sounds: Normal heart sounds.  Pulmonary:     Effort: Pulmonary effort is normal.     Breath sounds: Normal breath sounds.  Abdominal:     General: Abdomen is flat. Bowel sounds are normal.     Tenderness: There is no abdominal tenderness. There is no guarding or rebound.  Musculoskeletal: Normal range of motion.  Skin:    General: Skin is warm and dry.     Capillary Refill: Capillary refill takes less than 2  seconds.  Neurological:     General: No focal deficit present.     Mental Status: She is alert and oriented to person, place, and time.  Psychiatric:        Mood and Affect: Mood normal.        Behavior: Behavior normal.      ED Treatments / Results  Labs (all labs ordered are listed, but only abnormal results are displayed) Results for orders placed or performed during the hospital encounter of 03/20/19  Lipase, blood  Result Value Ref Range   Lipase 37 11 - 51 U/L  Comprehensive metabolic panel  Result Value Ref Range   Sodium 140 135 - 145 mmol/L   Potassium 3.4 (L) 3.5 - 5.1 mmol/L   Chloride 107 98 - 111 mmol/L   CO2 23 22 - 32 mmol/L   Glucose, Bld 146 (H) 70 - 99 mg/dL   BUN 12 8 - 23 mg/dL   Creatinine, Ser 0.41 (L) 0.44 - 1.00 mg/dL   Calcium 9.0 8.9 - 10.3 mg/dL   Total Protein 6.9 6.5 - 8.1 g/dL   Albumin 3.8 3.5 - 5.0 g/dL   AST 52 (H) 15 - 41 U/L   ALT 37 0 - 44 U/L   Alkaline Phosphatase 73 38 - 126 U/L   Total Bilirubin 0.5 0.3 - 1.2 mg/dL   GFR calc non Af Amer >60 >60 mL/min   GFR calc Af Amer >60 >60 mL/min   Anion gap 10 5 - 15  CBC  Result Value Ref Range   WBC 9.7 4.0 - 10.5 K/uL   RBC 4.21 3.87 - 5.11 MIL/uL   Hemoglobin 13.8 12.0 - 15.0 g/dL   HCT 42.7 36.0 - 46.0 %   MCV 101.4 (H) 80.0 - 100.0 fL   MCH 32.8 26.0 - 34.0 pg   MCHC 32.3 30.0 - 36.0 g/dL   RDW 12.2 11.5 - 15.5 %   Platelets 225 150 - 400 K/uL   nRBC 0.0 0.0 - 0.2 %  Urinalysis, Routine w reflex microscopic  Result Value Ref Range   Color, Urine YELLOW YELLOW   APPearance CLEAR CLEAR   Specific Gravity, Urine 1.011 1.005 - 1.030   pH 6.0 5.0 - 8.0   Glucose, UA NEGATIVE NEGATIVE mg/dL   Hgb urine dipstick NEGATIVE NEGATIVE   Bilirubin Urine NEGATIVE NEGATIVE   Ketones, ur NEGATIVE NEGATIVE mg/dL   Protein, ur NEGATIVE NEGATIVE mg/dL   Nitrite NEGATIVE NEGATIVE   Leukocytes,Ua SMALL (A) NEGATIVE   RBC / HPF 0-5 0 - 5 RBC/hpf   WBC, UA 6-10 0 - 5 WBC/hpf   Bacteria, UA  NONE SEEN NONE SEEN   Squamous Epithelial / LPF 0-5 0 -  5  POC occult blood, ED  Result Value Ref Range   Fecal Occult Bld NEGATIVE NEGATIVE   Ct Abdomen Pelvis W Contrast  Result Date: 03/21/2019 CLINICAL DATA:  Pain and diarrhea status post colonoscopy. EXAM: CT ABDOMEN AND PELVIS WITH CONTRAST TECHNIQUE: Multidetector CT imaging of the abdomen and pelvis was performed using the standard protocol following bolus administration of intravenous contrast. CONTRAST:  167mL OMNIPAQUE IOHEXOL 300 MG/ML  SOLN COMPARISON:  CT dated August 13, 2016. FINDINGS: Lower chest: No acute abnormality. Hepatobiliary: There is a 1.6 cm hypoattenuating lesion in hepatic segment 2. This is slightly increased from prior study when it measured approximately 9 mm. There has been significant interval decrease in size of the right hepatic cyst. The gallbladder is unremarkable. Pancreas: Unremarkable. No pancreatic ductal dilatation or surrounding inflammatory changes. Spleen: Normal in size without focal abnormality. Adrenals/Urinary Tract: There are bilateral nonobstructing nephroliths measuring up to approximately 6 mm on the left. The adrenal glands are unremarkable. There is no hydronephrosis. There appears to be some bladder wall thickening which may be secondary to the degree of underdistention. Stomach/Bowel: There are scattered colonic diverticula. There is some wall thickening of the sigmoid colon without evidence of significant adjacent fat stranding. The appendix is normal. There is some stasis within the terminal ileum. No evidence of a small-bowel obstruction. There may be a small hiatal hernia. The stomach is otherwise unremarkable. There is a moderate amount of stool in the colon. Vascular/Lymphatic: Aortic atherosclerosis. No enlarged abdominal or pelvic lymph nodes. Reproductive: Uterus and bilateral adnexa are unremarkable. Other: There are bilateral fat containing inguinal hernias. Musculoskeletal: No fracture  is seen. IMPRESSION: 1. Sigmoid diverticulosis with findings suspicious for very early uncomplicated sigmoid diverticulitis. 2. Bilateral nonobstructing renal nephroliths. 3. Normal appendix in the right lower quadrant. 4. Moderate amount of stool in the colon. Electronically Signed   By: Constance Holster M.D.   On: 03/21/2019 02:14    EKG    Radiology Ct Abdomen Pelvis W Contrast  Result Date: 03/21/2019 CLINICAL DATA:  Pain and diarrhea status post colonoscopy. EXAM: CT ABDOMEN AND PELVIS WITH CONTRAST TECHNIQUE: Multidetector CT imaging of the abdomen and pelvis was performed using the standard protocol following bolus administration of intravenous contrast. CONTRAST:  160mL OMNIPAQUE IOHEXOL 300 MG/ML  SOLN COMPARISON:  CT dated August 13, 2016. FINDINGS: Lower chest: No acute abnormality. Hepatobiliary: There is a 1.6 cm hypoattenuating lesion in hepatic segment 2. This is slightly increased from prior study when it measured approximately 9 mm. There has been significant interval decrease in size of the right hepatic cyst. The gallbladder is unremarkable. Pancreas: Unremarkable. No pancreatic ductal dilatation or surrounding inflammatory changes. Spleen: Normal in size without focal abnormality. Adrenals/Urinary Tract: There are bilateral nonobstructing nephroliths measuring up to approximately 6 mm on the left. The adrenal glands are unremarkable. There is no hydronephrosis. There appears to be some bladder wall thickening which may be secondary to the degree of underdistention. Stomach/Bowel: There are scattered colonic diverticula. There is some wall thickening of the sigmoid colon without evidence of significant adjacent fat stranding. The appendix is normal. There is some stasis within the terminal ileum. No evidence of a small-bowel obstruction. There may be a small hiatal hernia. The stomach is otherwise unremarkable. There is a moderate amount of stool in the colon. Vascular/Lymphatic:  Aortic atherosclerosis. No enlarged abdominal or pelvic lymph nodes. Reproductive: Uterus and bilateral adnexa are unremarkable. Other: There are bilateral fat containing inguinal hernias. Musculoskeletal: No fracture is seen.  IMPRESSION: 1. Sigmoid diverticulosis with findings suspicious for very early uncomplicated sigmoid diverticulitis. 2. Bilateral nonobstructing renal nephroliths. 3. Normal appendix in the right lower quadrant. 4. Moderate amount of stool in the colon. Electronically Signed   By: Constance Holster M.D.   On: 03/21/2019 02:14    Procedures Procedures (including critical care time)  Medications Ordered in ED Medications  sodium chloride (PF) 0.9 % injection (has no administration in time range)  amoxicillin-clavulanate (AUGMENTIN) 875-125 MG per tablet 1 tablet (has no administration in time range)  sodium chloride flush (NS) 0.9 % injection 3 mL (3 mLs Intravenous Given 03/20/19 2344)  iohexol (OMNIPAQUE) 300 MG/ML solution 100 mL (100 mLs Intravenous Contrast Given 03/21/19 0126)  ketorolac (TORADOL) 30 MG/ML injection 15 mg (15 mg Intravenous Given 03/21/19 0209)  acetaminophen (TYLENOL) tablet 1,000 mg (1,000 mg Oral Given 03/21/19 0209)  alum & mag hydroxide-simeth (MAALOX/MYLANTA) 200-200-20 MG/5ML suspension 30 mL (30 mLs Oral Given 03/21/19 0209)    Will treat with augmentin for diverticulitis.  Tylenol and Nsaids for pain.  Miralax for ongoing constipation.   Final Clinical Impressions(s) / ED Diagnoses   Return for intractable cough, coughing up blood,fevers >100.4 unrelieved by medication, shortness of breath, intractable vomiting, chest pain, shortness of breath, weakness,numbness, changes in speech, facial asymmetry,abdominal pain, passing out,Inability to tolerate liquids or food, cough, altered mental status or any concerns. No signs of systemic illness or infection. The patient is nontoxic-appearing on exam and vital signs are within normal limits.   I  have reviewed the triage vital signs and the nursing notes. Pertinent labs &imaging results that were available during my care of the patient were reviewed by me and considered in my medical decision making (see chart for details).  After history, exam, and medical workup I feel the patient has been appropriately medically screened and is safe for discharge home. Pertinent diagnoses were discussed with the patient. Patient was given return precautions   Daylan Boggess, MD 03/21/19 [id number]

## 2019-03-25 DIAGNOSIS — K635 Polyp of colon: Secondary | ICD-10-CM | POA: Diagnosis not present

## 2019-03-25 DIAGNOSIS — D122 Benign neoplasm of ascending colon: Secondary | ICD-10-CM | POA: Diagnosis not present

## 2019-04-08 DIAGNOSIS — I10 Essential (primary) hypertension: Secondary | ICD-10-CM | POA: Diagnosis not present

## 2019-04-08 DIAGNOSIS — R7303 Prediabetes: Secondary | ICD-10-CM | POA: Diagnosis not present

## 2019-04-23 DIAGNOSIS — K5790 Diverticulosis of intestine, part unspecified, without perforation or abscess without bleeding: Secondary | ICD-10-CM | POA: Diagnosis not present

## 2019-04-23 DIAGNOSIS — K5900 Constipation, unspecified: Secondary | ICD-10-CM | POA: Diagnosis not present

## 2019-05-21 DIAGNOSIS — Z23 Encounter for immunization: Secondary | ICD-10-CM | POA: Diagnosis not present

## 2019-05-21 DIAGNOSIS — R21 Rash and other nonspecific skin eruption: Secondary | ICD-10-CM | POA: Diagnosis not present

## 2019-05-21 DIAGNOSIS — M2550 Pain in unspecified joint: Secondary | ICD-10-CM | POA: Diagnosis not present

## 2019-05-27 DIAGNOSIS — M310 Hypersensitivity angiitis: Secondary | ICD-10-CM | POA: Diagnosis not present

## 2019-05-27 DIAGNOSIS — Z85828 Personal history of other malignant neoplasm of skin: Secondary | ICD-10-CM | POA: Diagnosis not present

## 2019-05-27 DIAGNOSIS — L237 Allergic contact dermatitis due to plants, except food: Secondary | ICD-10-CM | POA: Diagnosis not present

## 2019-05-28 DIAGNOSIS — Z6824 Body mass index (BMI) 24.0-24.9, adult: Secondary | ICD-10-CM | POA: Diagnosis not present

## 2019-05-28 DIAGNOSIS — M064 Inflammatory polyarthropathy: Secondary | ICD-10-CM | POA: Diagnosis not present

## 2019-05-28 DIAGNOSIS — R21 Rash and other nonspecific skin eruption: Secondary | ICD-10-CM | POA: Diagnosis not present

## 2019-05-28 DIAGNOSIS — M2550 Pain in unspecified joint: Secondary | ICD-10-CM | POA: Diagnosis not present

## 2019-05-28 DIAGNOSIS — R5383 Other fatigue: Secondary | ICD-10-CM | POA: Diagnosis not present

## 2019-06-30 ENCOUNTER — Other Ambulatory Visit: Payer: Self-pay | Admitting: Family Medicine

## 2019-06-30 DIAGNOSIS — Z1231 Encounter for screening mammogram for malignant neoplasm of breast: Secondary | ICD-10-CM

## 2019-07-02 DIAGNOSIS — M2550 Pain in unspecified joint: Secondary | ICD-10-CM | POA: Diagnosis not present

## 2019-07-02 DIAGNOSIS — Z7952 Long term (current) use of systemic steroids: Secondary | ICD-10-CM | POA: Diagnosis not present

## 2019-07-02 DIAGNOSIS — M353 Polymyalgia rheumatica: Secondary | ICD-10-CM | POA: Diagnosis not present

## 2019-07-14 DIAGNOSIS — Z85828 Personal history of other malignant neoplasm of skin: Secondary | ICD-10-CM | POA: Diagnosis not present

## 2019-07-14 DIAGNOSIS — L821 Other seborrheic keratosis: Secondary | ICD-10-CM | POA: Diagnosis not present

## 2019-08-14 ENCOUNTER — Other Ambulatory Visit: Payer: Self-pay

## 2019-08-14 ENCOUNTER — Ambulatory Visit
Admission: RE | Admit: 2019-08-14 | Discharge: 2019-08-14 | Disposition: A | Discharge status: Home / Self Care | Payer: Medicare Other | Source: Ambulatory Visit | Attending: Family Medicine | Admitting: Family Medicine

## 2019-08-14 DIAGNOSIS — Z1231 Encounter for screening mammogram for malignant neoplasm of breast: Secondary | ICD-10-CM

## 2019-08-31 DIAGNOSIS — M2550 Pain in unspecified joint: Secondary | ICD-10-CM | POA: Diagnosis not present

## 2019-08-31 DIAGNOSIS — M353 Polymyalgia rheumatica: Secondary | ICD-10-CM | POA: Diagnosis not present

## 2019-08-31 DIAGNOSIS — Z7952 Long term (current) use of systemic steroids: Secondary | ICD-10-CM | POA: Diagnosis not present

## 2019-10-01 DIAGNOSIS — Z0000 Encounter for general adult medical examination without abnormal findings: Secondary | ICD-10-CM | POA: Diagnosis not present

## 2019-10-01 DIAGNOSIS — I10 Essential (primary) hypertension: Secondary | ICD-10-CM | POA: Diagnosis not present

## 2019-10-01 DIAGNOSIS — M353 Polymyalgia rheumatica: Secondary | ICD-10-CM | POA: Diagnosis not present

## 2019-10-01 DIAGNOSIS — E7800 Pure hypercholesterolemia, unspecified: Secondary | ICD-10-CM | POA: Diagnosis not present

## 2019-10-01 DIAGNOSIS — Z1389 Encounter for screening for other disorder: Secondary | ICD-10-CM | POA: Diagnosis not present

## 2019-10-01 DIAGNOSIS — R7309 Other abnormal glucose: Secondary | ICD-10-CM | POA: Diagnosis not present

## 2019-10-14 ENCOUNTER — Ambulatory Visit: Payer: Medicare Other | Attending: Internal Medicine

## 2019-10-14 DIAGNOSIS — Z23 Encounter for immunization: Secondary | ICD-10-CM | POA: Insufficient documentation

## 2019-10-14 NOTE — Progress Notes (Signed)
   Covid-19 Vaccination Clinic  Name:  Kim Moreno    MRN: [id number] DOB: [birth year]  10/14/2019  Kim Moreno was observed post Covid-19 immunization for 15 minutes without incidence. She was provided with Vaccine Information Sheet and instruction to access the V-Safe system.   Kim Moreno was instructed to call 911 with any severe reactions post vaccine: Marland Kitchen Difficulty breathing  . Swelling of your face and throat  . A fast heartbeat  . A bad rash all over your body  . Dizziness and weakness    Immunizations Administered    Name Date Dose VIS Date Route   Pfizer COVID-19 Vaccine 10/14/2019  5:49 PM 0.3 mL 09/04/2019 Intramuscular   Manufacturer: [place]   Lot: [id number]   [place]: [id number]

## 2019-11-03 DIAGNOSIS — B079 Viral wart, unspecified: Secondary | ICD-10-CM | POA: Diagnosis not present

## 2019-11-03 DIAGNOSIS — Z85828 Personal history of other malignant neoplasm of skin: Secondary | ICD-10-CM | POA: Diagnosis not present

## 2019-11-03 DIAGNOSIS — D485 Neoplasm of uncertain behavior of skin: Secondary | ICD-10-CM | POA: Diagnosis not present

## 2019-11-05 ENCOUNTER — Ambulatory Visit: Payer: Medicare Other | Attending: Internal Medicine

## 2019-11-05 DIAGNOSIS — Z23 Encounter for immunization: Secondary | ICD-10-CM

## 2019-11-05 NOTE — Progress Notes (Signed)
   Covid-19 Vaccination Clinic  Name:  SHANVI MCALISTER    MRN: [id number] DOB: [birth year]  11/05/2019  Ms. Shein was observed post Covid-19 immunization for 15 minutes without incidence. She was provided with Vaccine Information Sheet and instruction to access the V-Safe system.   Ms. Mellem was instructed to call 911 with any severe reactions post vaccine: Marland Kitchen Difficulty breathing  . Swelling of your face and throat  . A fast heartbeat  . A bad rash all over your body  . Dizziness and weakness    Immunizations Administered    Name Date Dose VIS Date Route   Pfizer COVID-19 Vaccine 11/05/2019  8:33 AM 0.3 mL 09/04/2019 Intramuscular   Manufacturer: [place]   Lot: [id number]   [place]: [id number]

## 2019-11-22 ENCOUNTER — Ambulatory Visit: Payer: Medicare Other

## 2019-11-23 DIAGNOSIS — H02831 Dermatochalasis of right upper eyelid: Secondary | ICD-10-CM | POA: Diagnosis not present

## 2019-11-23 DIAGNOSIS — H02834 Dermatochalasis of left upper eyelid: Secondary | ICD-10-CM | POA: Diagnosis not present

## 2019-11-23 DIAGNOSIS — Z961 Presence of intraocular lens: Secondary | ICD-10-CM | POA: Diagnosis not present

## 2019-11-23 DIAGNOSIS — H5213 Myopia, bilateral: Secondary | ICD-10-CM | POA: Diagnosis not present

## 2019-11-30 DIAGNOSIS — E663 Overweight: Secondary | ICD-10-CM | POA: Diagnosis not present

## 2019-11-30 DIAGNOSIS — Z7952 Long term (current) use of systemic steroids: Secondary | ICD-10-CM | POA: Diagnosis not present

## 2019-11-30 DIAGNOSIS — M353 Polymyalgia rheumatica: Secondary | ICD-10-CM | POA: Diagnosis not present

## 2019-11-30 DIAGNOSIS — M2550 Pain in unspecified joint: Secondary | ICD-10-CM | POA: Diagnosis not present

## 2019-11-30 DIAGNOSIS — Z6825 Body mass index (BMI) 25.0-25.9, adult: Secondary | ICD-10-CM | POA: Diagnosis not present

## 2020-02-01 DIAGNOSIS — D692 Other nonthrombocytopenic purpura: Secondary | ICD-10-CM | POA: Diagnosis not present

## 2020-02-01 DIAGNOSIS — Z85828 Personal history of other malignant neoplasm of skin: Secondary | ICD-10-CM | POA: Diagnosis not present

## 2020-02-01 DIAGNOSIS — L570 Actinic keratosis: Secondary | ICD-10-CM | POA: Diagnosis not present

## 2020-02-01 DIAGNOSIS — D485 Neoplasm of uncertain behavior of skin: Secondary | ICD-10-CM | POA: Diagnosis not present

## 2020-02-01 DIAGNOSIS — D2272 Melanocytic nevi of left lower limb, including hip: Secondary | ICD-10-CM | POA: Diagnosis not present

## 2020-02-01 DIAGNOSIS — D225 Melanocytic nevi of trunk: Secondary | ICD-10-CM | POA: Diagnosis not present

## 2020-02-01 DIAGNOSIS — D1801 Hemangioma of skin and subcutaneous tissue: Secondary | ICD-10-CM | POA: Diagnosis not present

## 2020-02-01 DIAGNOSIS — C44319 Basal cell carcinoma of skin of other parts of face: Secondary | ICD-10-CM | POA: Diagnosis not present

## 2020-02-01 DIAGNOSIS — L821 Other seborrheic keratosis: Secondary | ICD-10-CM | POA: Diagnosis not present

## 2020-02-01 DIAGNOSIS — L814 Other melanin hyperpigmentation: Secondary | ICD-10-CM | POA: Diagnosis not present

## 2020-02-10 DIAGNOSIS — H57813 Brow ptosis, bilateral: Secondary | ICD-10-CM | POA: Diagnosis not present

## 2020-02-10 DIAGNOSIS — H02831 Dermatochalasis of right upper eyelid: Secondary | ICD-10-CM | POA: Diagnosis not present

## 2020-02-10 DIAGNOSIS — H02834 Dermatochalasis of left upper eyelid: Secondary | ICD-10-CM | POA: Diagnosis not present

## 2020-02-15 DIAGNOSIS — R310 Gross hematuria: Secondary | ICD-10-CM | POA: Diagnosis not present

## 2020-02-15 DIAGNOSIS — R300 Dysuria: Secondary | ICD-10-CM | POA: Diagnosis not present

## 2020-02-17 ENCOUNTER — Other Ambulatory Visit: Payer: Self-pay | Admitting: Physician Assistant

## 2020-02-17 DIAGNOSIS — R310 Gross hematuria: Secondary | ICD-10-CM

## 2020-02-18 ENCOUNTER — Ambulatory Visit
Admission: RE | Admit: 2020-02-18 | Discharge: 2020-02-18 | Disposition: A | Discharge status: Home / Self Care | Payer: Medicare Other | Source: Ambulatory Visit | Attending: Physician Assistant | Admitting: Physician Assistant

## 2020-02-18 DIAGNOSIS — C44319 Basal cell carcinoma of skin of other parts of face: Secondary | ICD-10-CM | POA: Diagnosis not present

## 2020-02-18 DIAGNOSIS — R310 Gross hematuria: Secondary | ICD-10-CM

## 2020-02-18 DIAGNOSIS — Z85828 Personal history of other malignant neoplasm of skin: Secondary | ICD-10-CM | POA: Diagnosis not present

## 2020-02-18 DIAGNOSIS — N132 Hydronephrosis with renal and ureteral calculous obstruction: Secondary | ICD-10-CM | POA: Diagnosis not present

## 2020-02-18 DIAGNOSIS — K8020 Calculus of gallbladder without cholecystitis without obstruction: Secondary | ICD-10-CM | POA: Diagnosis not present

## 2020-03-01 DIAGNOSIS — Z08 Encounter for follow-up examination after completed treatment for malignant neoplasm: Secondary | ICD-10-CM | POA: Diagnosis not present

## 2020-03-02 DIAGNOSIS — Z6823 Body mass index (BMI) 23.0-23.9, adult: Secondary | ICD-10-CM | POA: Diagnosis not present

## 2020-03-02 DIAGNOSIS — M353 Polymyalgia rheumatica: Secondary | ICD-10-CM | POA: Diagnosis not present

## 2020-03-02 DIAGNOSIS — M2550 Pain in unspecified joint: Secondary | ICD-10-CM | POA: Diagnosis not present

## 2020-03-02 DIAGNOSIS — Z7952 Long term (current) use of systemic steroids: Secondary | ICD-10-CM | POA: Diagnosis not present

## 2020-03-04 DIAGNOSIS — R319 Hematuria, unspecified: Secondary | ICD-10-CM | POA: Diagnosis not present

## 2020-04-11 DIAGNOSIS — H02834 Dermatochalasis of left upper eyelid: Secondary | ICD-10-CM | POA: Diagnosis not present

## 2020-04-11 DIAGNOSIS — H02831 Dermatochalasis of right upper eyelid: Secondary | ICD-10-CM | POA: Diagnosis not present

## 2020-04-11 DIAGNOSIS — H57813 Brow ptosis, bilateral: Secondary | ICD-10-CM | POA: Diagnosis not present

## 2020-04-29 DIAGNOSIS — R310 Gross hematuria: Secondary | ICD-10-CM | POA: Diagnosis not present

## 2020-04-29 DIAGNOSIS — N202 Calculus of kidney with calculus of ureter: Secondary | ICD-10-CM | POA: Diagnosis not present

## 2020-05-02 ENCOUNTER — Other Ambulatory Visit: Payer: Self-pay | Admitting: Urology

## 2020-05-16 DIAGNOSIS — I4510 Unspecified right bundle-branch block: Secondary | ICD-10-CM | POA: Diagnosis not present

## 2020-05-16 DIAGNOSIS — I10 Essential (primary) hypertension: Secondary | ICD-10-CM | POA: Diagnosis not present

## 2020-05-16 DIAGNOSIS — Z01818 Encounter for other preprocedural examination: Secondary | ICD-10-CM | POA: Diagnosis not present

## 2020-05-23 IMAGING — CT CT ABDOMEN AND PELVIS WITH CONTRAST
2 of 5 series · 16 of 46 positions shown, 18 images · IV contrast (ISOVUE)
Comparison: CT dated August 13, 2016.

CLINICAL DATA: Pain and diarrhea status post colonoscopy.

EXAM:
CT ABDOMEN AND PELVIS WITH CONTRAST
TECHNIQUE: Multidetector CT imaging of the abdomen and pelvis was performed
using the standard protocol following bolus administration of
intravenous contrast.
CONTRAST:  100mL OMNIPAQUE IOHEXOL 300 MG/ML  SOLN

[Series 2: axial st · axial · 0.71mm/px · z∈[-433,-108]mm · 13 of 75 slices shown, 15 images]
[im 5/75  soft-tissue]
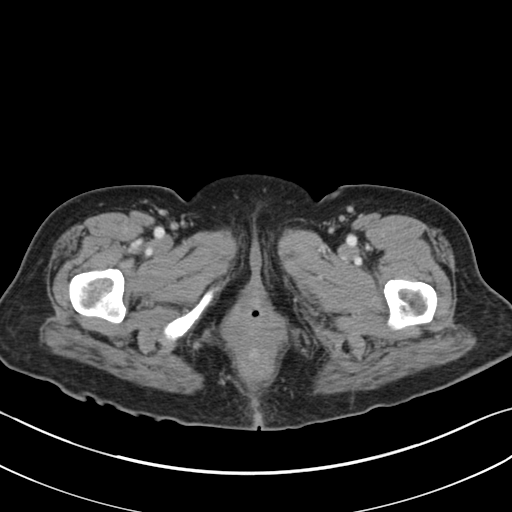
[im 5/75  bone]
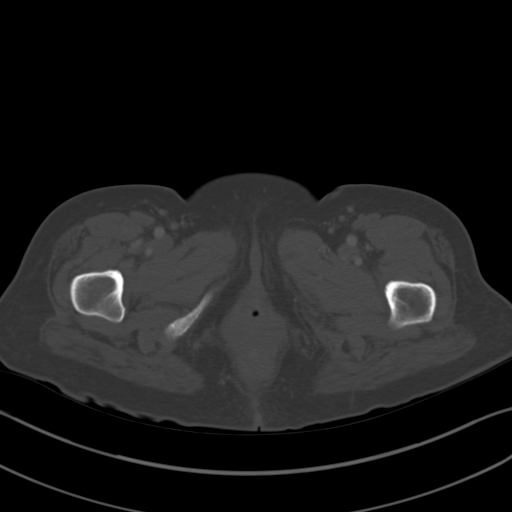
[im 9/75  soft-tissue]
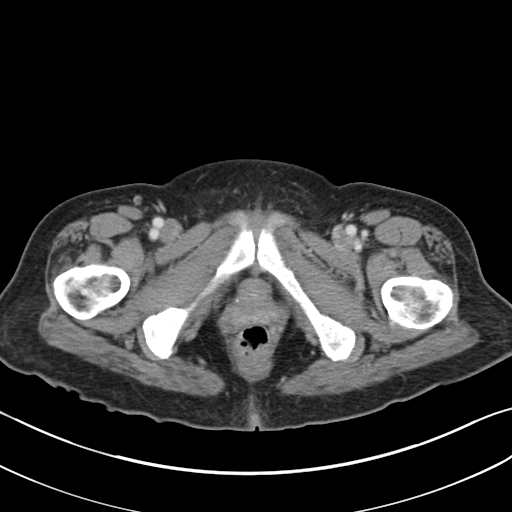
[im 18/75  soft-tissue]
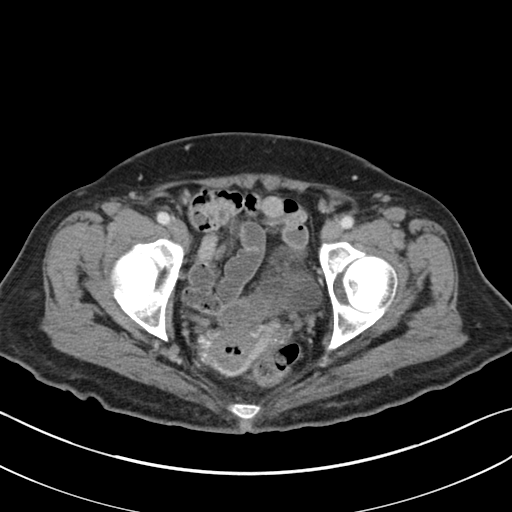
[im 22/75  soft-tissue]
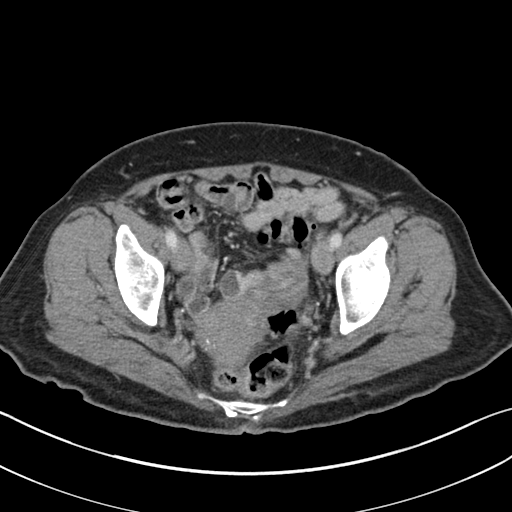
[im 27/75  soft-tissue]
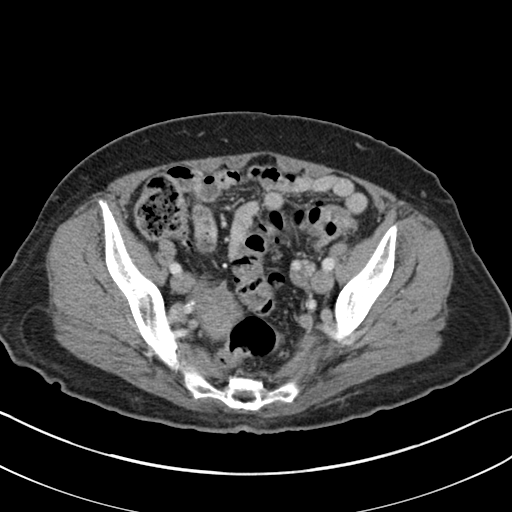
[im 31/75  soft-tissue]
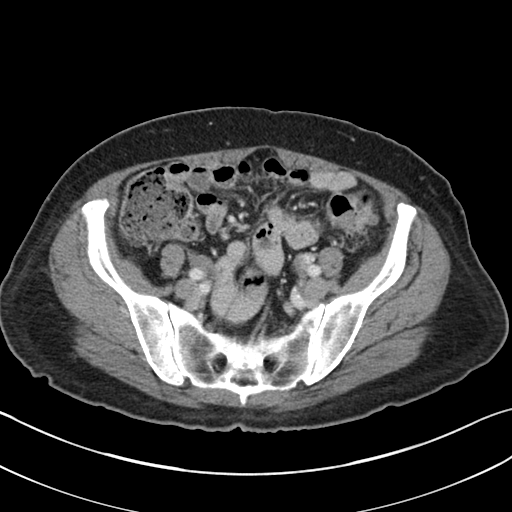
[im 40/75  soft-tissue]
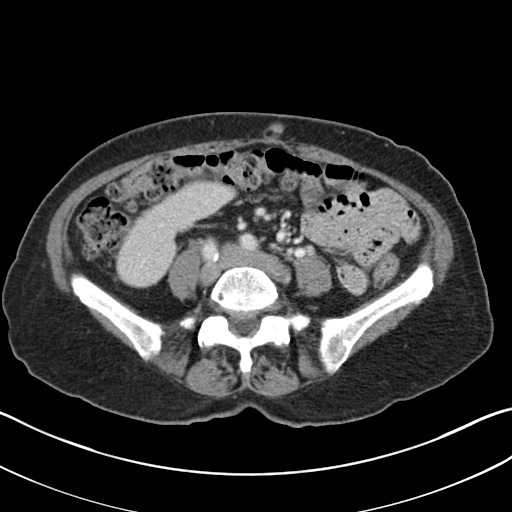
[im 44/75  soft-tissue]
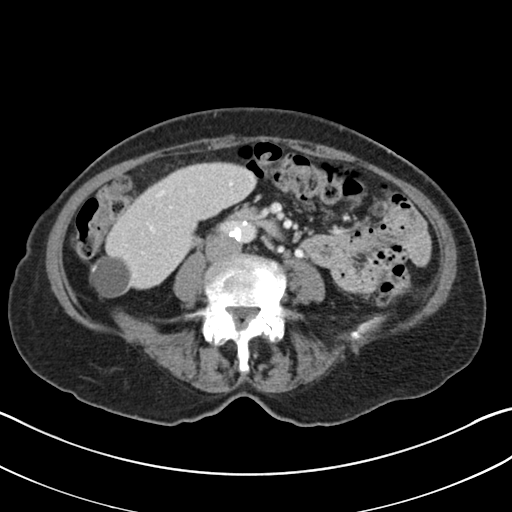
[im 48/75  soft-tissue]
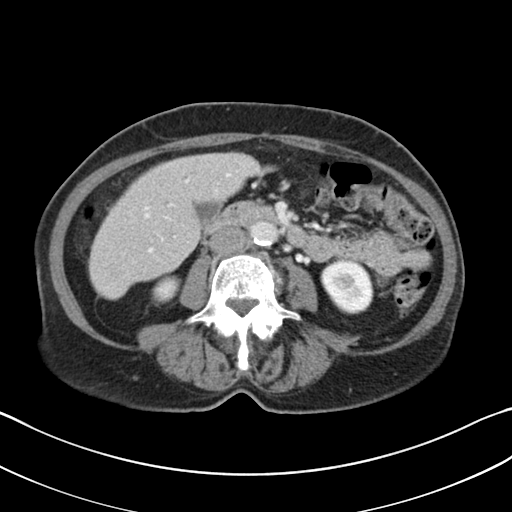
[im 48/75  bone]
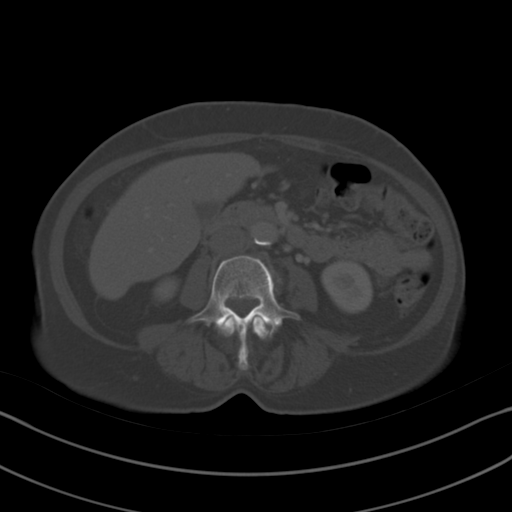
[im 53/75  soft-tissue]
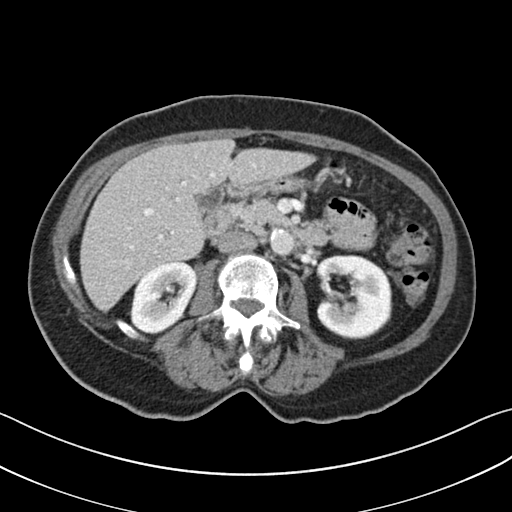
[im 57/75  soft-tissue]
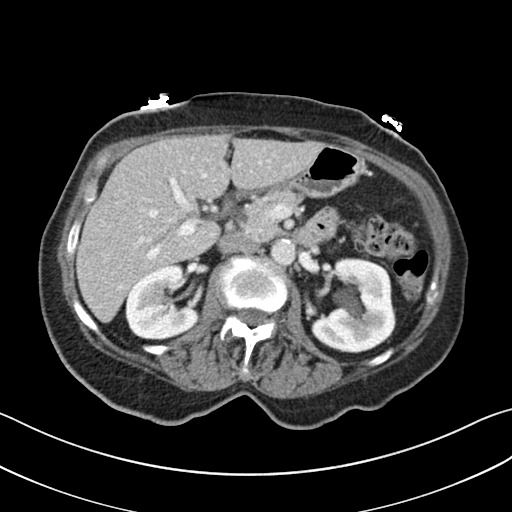
[im 66/75  soft-tissue]
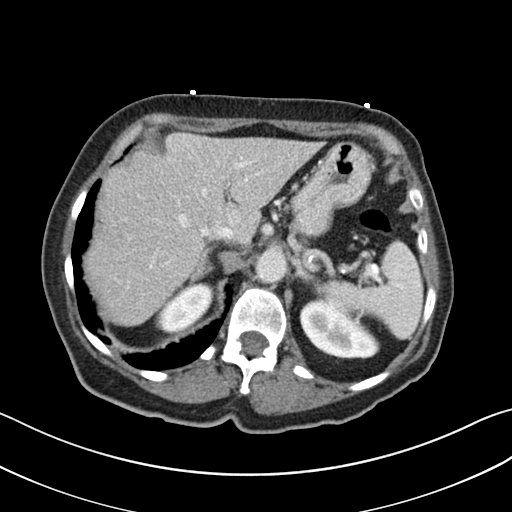
[im 70/75  soft-tissue]
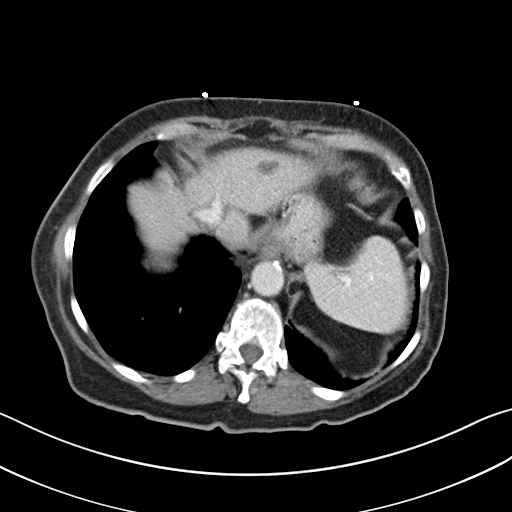

[Series 5: coronal st · coronal · 0.67mm/px · 3 of 126 slices shown]
[im 42/126  soft-tissue]
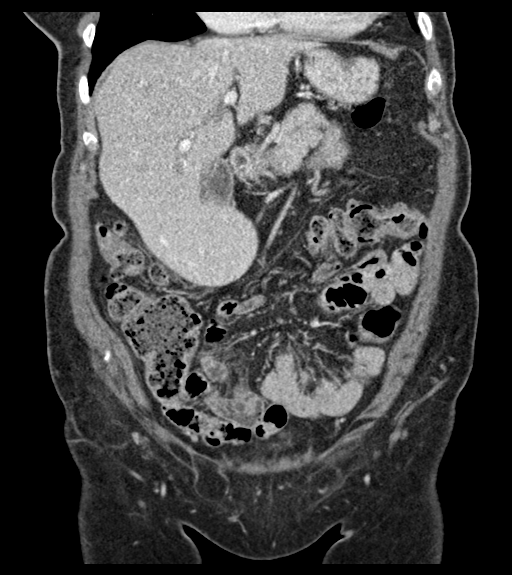
[im 56/126  soft-tissue]
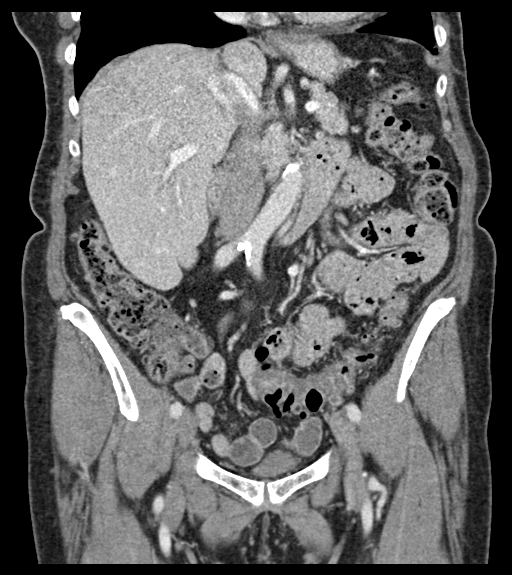
[im 70/126  soft-tissue]
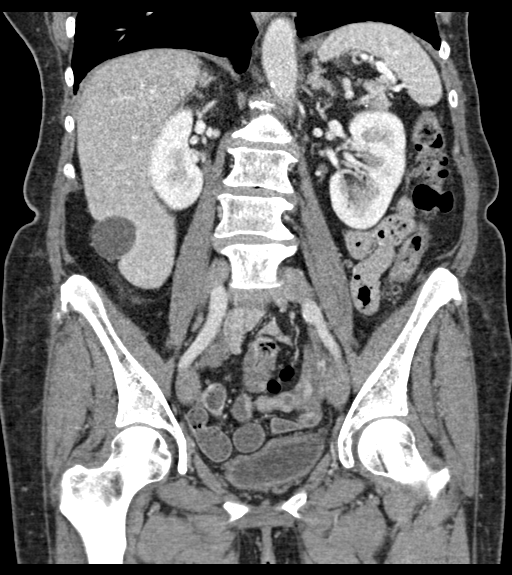

[16 of 46 positions shown; findings below may reference images not displayed]

FINDINGS: Lower chest: No acute abnormality.

Hepatobiliary: There is a 1.6 cm hypoattenuating lesion in hepatic
segment 2. This is slightly increased from prior study when it
measured approximately 9 mm. There has been significant interval
decrease in size of the right hepatic cyst. The gallbladder is
unremarkable.

Pancreas: Unremarkable. No pancreatic ductal dilatation or
surrounding inflammatory changes.

Spleen: Normal in size without focal abnormality.

Adrenals/Urinary Tract: There are bilateral nonobstructing
nephroliths measuring up to approximately 6 mm on the left. The
adrenal glands are unremarkable. There is no hydronephrosis. There
appears to be some bladder wall thickening which may be secondary to
the degree of underdistention.

Stomach/Bowel: There are scattered colonic diverticula. There is
some wall thickening of the sigmoid colon without evidence of
significant adjacent fat stranding. The appendix is normal. There is
some stasis within the terminal ileum. No evidence of a small-bowel
obstruction. There may be a small hiatal hernia. The stomach is
otherwise unremarkable. There is a moderate amount of stool in the
colon.

Vascular/Lymphatic: Aortic atherosclerosis. No enlarged abdominal or
pelvic lymph nodes.

Reproductive: Uterus and bilateral adnexa are unremarkable.

Other: There are bilateral fat containing inguinal hernias.

Musculoskeletal: No fracture is seen.
IMPRESSION: 1. Sigmoid diverticulosis with findings suspicious for very early
uncomplicated sigmoid diverticulitis.
2. Bilateral nonobstructing renal nephroliths.
3. Normal appendix in the right lower quadrant.
4. Moderate amount of stool in the colon.

## 2020-05-31 ENCOUNTER — Encounter (HOSPITAL_BASED_OUTPATIENT_CLINIC_OR_DEPARTMENT_OTHER): Payer: Self-pay

## 2020-05-31 ENCOUNTER — Ambulatory Visit (HOSPITAL_BASED_OUTPATIENT_CLINIC_OR_DEPARTMENT_OTHER): Admit: 2020-05-31 | Payer: Medicare Other | Admitting: Urology

## 2020-05-31 SURGERY — CYSTOURETEROSCOPY, WITH RETROGRADE PYELOGRAM AND STENT INSERTION
Anesthesia: General | Laterality: Left

## 2020-06-07 DIAGNOSIS — M353 Polymyalgia rheumatica: Secondary | ICD-10-CM | POA: Diagnosis not present

## 2020-06-07 DIAGNOSIS — Z7952 Long term (current) use of systemic steroids: Secondary | ICD-10-CM | POA: Diagnosis not present

## 2020-06-07 DIAGNOSIS — Z6823 Body mass index (BMI) 23.0-23.9, adult: Secondary | ICD-10-CM | POA: Diagnosis not present

## 2020-06-07 DIAGNOSIS — M2550 Pain in unspecified joint: Secondary | ICD-10-CM | POA: Diagnosis not present

## 2020-06-21 ENCOUNTER — Other Ambulatory Visit: Payer: Self-pay

## 2020-06-21 ENCOUNTER — Ambulatory Visit (INDEPENDENT_AMBULATORY_CARE_PROVIDER_SITE_OTHER): Payer: Medicare Other | Admitting: Internal Medicine

## 2020-06-21 VITALS — BP 134/66 | HR 68 | Ht 62.0 in | Wt 129.0 lb

## 2020-06-21 DIAGNOSIS — I4510 Unspecified right bundle-branch block: Secondary | ICD-10-CM

## 2020-06-21 DIAGNOSIS — E785 Hyperlipidemia, unspecified: Secondary | ICD-10-CM | POA: Diagnosis not present

## 2020-06-21 DIAGNOSIS — I10 Essential (primary) hypertension: Secondary | ICD-10-CM

## 2020-06-21 NOTE — Patient Instructions (Signed)
Medication Instructions:  No Changes In Medications at this time.  *If you need a refill on your cardiac medications before your next appointment, please call your pharmacy*  Lab Work: None Ordered At This Time.  If you have labs (blood work) drawn today and your tests are completely normal, you will receive your results only by: . MyChart Message (if you have MyChart) OR . A paper copy in the mail If you have any lab test that is abnormal or we need to change your treatment, we will call you to review the results.  Testing/Procedures: None Ordered At This Time.   Follow-Up: At [facility], you and your health needs are our priority.  As part of our continuing mission to provide you with exceptional heart care, we have created designated Provider Care Teams.  These Care Teams include your primary Cardiologist (physician) and Advanced Practice Providers (APPs -  Physician Assistants and Nurse Practitioners) who all work together to provide you with the care you need, when you need it.  Your next appointment:   6 month(s)  The format for your next appointment:   In Person  Provider:   Gayatri Acharya, MD   

## 2020-06-21 NOTE — Progress Notes (Signed)
Cardiology Office Note:    Date:  06/21/2020   ID:  Kim Moreno, DOB 1946/06/20, MRN [id number]  PCP:  Carol Ada, MD  Cardiologist:  No primary care provider on file.  Electrophysiologist:  None   Referring MD: Carol Ada, MD   Chief Complaint/Reason for Referral: RBBB  History of Present Illness:    Kim Moreno is a 74 y.o. female with a history of hypertension, breast cancer with radiation therapy to the left breast in 2008, who presents for evaluation of right bundle branch block. She was planning to have a urologic procedure and a change was noted on her ECG therefore she was referred to cardiology. She previously has seen my colleague Dr. Tollie Eth. Since that time she has lost 25 lbs. She feels well overall.   She denies chest pain, chest pressure, dyspnea at rest or with exertion, palpitations, PND, orthopnea, or leg swelling. Denies cough, fever, chills. Denies nausea, vomiting. Denies syncope or presyncope. Denies dizziness or lightheadedness. Denies snoring.  FHx - brother CAD and father - MI. Mother had an "attack" in 45s.   Past Medical History:  Diagnosis Date  . Personal history of radiation therapy 2008   left breast    Past Surgical History:  Procedure Laterality Date  . BREAST LUMPECTOMY Left 2007    Current Medications: Current Meds  Medication Sig  . Alpha-Lipoic Acid 100 MG TABS Take 100 mg by mouth daily. Pt takes 1/2 tab  . amLODipine (NORVASC) 10 MG tablet Take 10 mg by mouth daily.   Marland Kitchen aspirin 81 MG chewable tablet Chew 81 mg by mouth daily.  . Beta Carotene (VITAMIN A) 25000 UNIT capsule Take 7,000 Units by mouth daily.  Marland Kitchen Co-Enzyme Q-10 100 MG CAPS Take 100 mg by mouth daily. Pt takes 1/2 tab  . Copper Gluconate (COPPER CAPS PO) Take 5 mg by mouth daily. Pt takes 1/2 tab  . Docosahexaenoic Acid (DHA PO) Take 300 mg by mouth daily. Pt takes 1/2 tab  . docusate sodium (COLACE) 100 MG capsule Take 100 mg by mouth daily.  .  ergocalciferol (DRISDOL) 200 MCG/ML drops Take 4,000 Units by mouth daily.  Marland Kitchen IRON-VITAMINS PO Take 6 mg by mouth.  . LUTEIN PO Take 3 mg by mouth daily. Pt takes 1/2 tab  . LYCOPENE PO Take 1 mg by mouth daily. Pt takes 1/2 tab  . MAGNESIUM PO Take by mouth. Pt takes 1/2 tablet  . MANGANESE PO Take 0.5 mg by mouth.  . Milk Thistle-Turmeric (SILYMARIN PO) Take 100 mg by mouth daily. Pt takes 1/2 tab  . Misc Natural Products (GREEN FOOD COMPLEX PO) Take 2 capsules by mouth daily.  . Multiple Vitamins-Minerals (ZINC PO) Take 15 mg by mouth daily. Pt takes 1/2 tab  . NIACIN PO Take 20 mg by mouth daily. Pt takes 1/2 tab  . olmesartan (BENICAR) 20 MG tablet Take 20 mg by mouth daily.   Marland Kitchen omega-3 acid ethyl esters (LOVAZA) 1 g capsule Take 2 g by mouth daily.  . Omega-3 Fatty Acids (EPA PO) Take by mouth.  Marland Kitchen PANTOTHENIC ACID PO Take 10 mg by mouth. Pt take 1/2 tab  . Probiotic Product (PROBIOTIC PO) Take by mouth daily.  . Quercetin 50 MG TABS Take 50 mg by mouth daily. Pt takes 1/2 tab  . RESVERATROL PO Take 50 mg by mouth daily. Pt takes 1/2 tab  . RIBOFLAVIN PO Take 3 mg by mouth daily. Pt takes 1/2 tab  .  SELENIUM PO Take 70 mcg by mouth daily. Pt takes 1/2 tab  . Thiamine HCl (THIAMINE PO) Take 3 mg by mouth daily. Pt takes 1/2 tab     Allergies:   Arimidex [anastrozole] and Latex   Social History   Tobacco Use  . Smoking status: Not on file  Substance Use Topics  . Alcohol use: Not on file  . Drug use: Not on file     Family History: The patient's family history is not on file.  ROS:   Please see the history of present illness.    All other systems reviewed and are negative.  EKGs/Labs/Other Studies Reviewed:    The following studies were reviewed today:  EKG:  NSR, LAE, RBBB, ?lateral and inferior infarct  I have independently reviewed the images from CT abdomen pelvis 02/18/2020 without contrast.  Evidence of severe mitral annular calcification, aortic atherosclerosis,  probable distal RCA calcification, grossly normal appearance of the pericardium.  Recent Labs: No results found for requested labs within last 8760 hours.  Recent Lipid Panel No results found for: CHOL, TRIG, HDL, CHOLHDL, VLDL, LDLCALC, LDLDIRECT  Physical Exam:    VS:  BP 134/68   Ht 5\' 2"  (1.575 m)   Wt 129 lb (58.5 kg)   SpO2 96%   BMI 23.59 kg/m     Wt Readings from Last 5 Encounters:  06/21/20 129 lb (58.5 kg)    Constitutional: No acute distress Eyes: sclera non-icteric, normal conjunctiva and lids ENMT: normal dentition, moist mucous membranes Cardiovascular: regular rhythm, normal rate, no murmurs. S1 and S2 normal. Radial pulses normal bilaterally. No jugular venous distention.  Respiratory: clear to auscultation bilaterally GI : normal bowel sounds, soft and nontender. No distention.   MSK: extremities warm, well perfused. No edema.  NEURO: grossly nonfocal exam, moves all extremities. PSYCH: alert and oriented x 3, normal mood and affect.   ASSESSMENT:    1. Right bundle branch block   2. Essential hypertension   3. Hyperlipidemia, unspecified hyperlipidemia type    PLAN:    Right bundle branch block - Plan: EKG 12-Lead We discussed the generally benign nature of right bundle branch block in the absence of structural heart disease.  We do not have an echocardiogram and I would like to perform 1 to evaluate for structural abnormalities that may be contributing.  Since she is feeling well, she would like to defer echo at this time.  We discussed red flag symptoms to contact our office or present to the ED including dizziness, lightheadedness, syncope, or effort intolerance.  Essential hypertension -We discussed goal blood pressure around 120/80, she should continue amlodipine 10 mg daily, and olmesartan 20 mg daily.  Hyperlipidemia, unspecified hyperlipidemia type -She is not currently on lipid-lowering therapy and most recent LDL is 135 with a goal of less than  70.  Would recommend initiation of statin therapy.  Patient defers at this time.  Polypharmacy with supplements -Patient provides an extensive list of supplements.  Would caution against the use of supplements that are not approved by the FDA.  Optimally, medication should be reviewed with primary care physician frequently to avoid interactions.  Would be happy to offer our pharmacist services if needed to review supplement and medication interactions.  Total time of encounter: 45 minutes total time of encounter, including 25 minutes spent in face-to-face patient care on the date of this encounter. This time includes coordination of care and counseling regarding above mentioned problem list. Remainder of non-face-to-face time involved  reviewing chart documents/testing relevant to the patient encounter and documentation in the medical record. I have independently reviewed documentation from referring provider. Approximately 20 pages of outside medical records reviewed in conjunction with this consultation.  Cherlynn Kaiser, MD [place]  [facility]    Medication Adjustments/Labs and Tests Ordered: Current medicines are reviewed at length with the patient today.  Concerns regarding medicines are outlined above.   Orders Placed This Encounter  Procedures  . EKG 12-Lead    No orders of the defined types were placed in this encounter.   Patient Instructions  Medication Instructions:  No Changes In Medications at this time.  *If you need a refill on your cardiac medications before your next appointment, please call your pharmacy*  Lab Work: None Ordered At This Time.  If you have labs (blood work) drawn today and your tests are completely normal, you will receive your results only by: Marland Kitchen MyChart Message (if you have MyChart) OR . A paper copy in the mail If you have any lab test that is abnormal or we need to change your treatment, we will call you to review the  results.  Testing/Procedures: None Ordered At This Time.   Follow-Up: At [facility], you and your health needs are our priority.  As part of our continuing mission to provide you with exceptional heart care, we have created designated Provider Care Teams.  These Care Teams include your primary Cardiologist (physician) and Advanced Practice Providers (APPs -  Physician Assistants and Nurse Practitioners) who all work together to provide you with the care you need, when you need it.  Your next appointment:   6 month(s)  The format for your next appointment:   In Person  Provider:   Cherlynn Kaiser, MD

## 2020-07-02 DIAGNOSIS — L239 Allergic contact dermatitis, unspecified cause: Secondary | ICD-10-CM | POA: Diagnosis not present

## 2020-07-02 DIAGNOSIS — R21 Rash and other nonspecific skin eruption: Secondary | ICD-10-CM | POA: Diagnosis not present

## 2020-07-12 ENCOUNTER — Other Ambulatory Visit: Payer: Self-pay | Admitting: Family Medicine

## 2020-07-12 DIAGNOSIS — Z1231 Encounter for screening mammogram for malignant neoplasm of breast: Secondary | ICD-10-CM

## 2020-07-13 DIAGNOSIS — D692 Other nonthrombocytopenic purpura: Secondary | ICD-10-CM | POA: Diagnosis not present

## 2020-07-13 DIAGNOSIS — L300 Nummular dermatitis: Secondary | ICD-10-CM | POA: Diagnosis not present

## 2020-07-13 DIAGNOSIS — Z85828 Personal history of other malignant neoplasm of skin: Secondary | ICD-10-CM | POA: Diagnosis not present

## 2020-08-15 ENCOUNTER — Ambulatory Visit
Admission: RE | Admit: 2020-08-15 | Discharge: 2020-08-15 | Disposition: A | Discharge status: Home / Self Care | Payer: Medicare Other | Source: Ambulatory Visit | Attending: Family Medicine | Admitting: Family Medicine

## 2020-08-15 ENCOUNTER — Other Ambulatory Visit: Payer: Self-pay

## 2020-08-15 DIAGNOSIS — Z1231 Encounter for screening mammogram for malignant neoplasm of breast: Secondary | ICD-10-CM | POA: Diagnosis not present

## 2020-08-22 ENCOUNTER — Other Ambulatory Visit: Payer: Self-pay | Admitting: Family Medicine

## 2020-08-22 DIAGNOSIS — R928 Other abnormal and inconclusive findings on diagnostic imaging of breast: Secondary | ICD-10-CM

## 2020-08-29 DIAGNOSIS — L309 Dermatitis, unspecified: Secondary | ICD-10-CM | POA: Diagnosis not present

## 2020-08-29 DIAGNOSIS — L821 Other seborrheic keratosis: Secondary | ICD-10-CM | POA: Diagnosis not present

## 2020-08-29 DIAGNOSIS — Z85828 Personal history of other malignant neoplasm of skin: Secondary | ICD-10-CM | POA: Diagnosis not present

## 2020-08-29 DIAGNOSIS — L570 Actinic keratosis: Secondary | ICD-10-CM | POA: Diagnosis not present

## 2020-08-31 ENCOUNTER — Ambulatory Visit: Payer: Medicare Other

## 2020-08-31 ENCOUNTER — Other Ambulatory Visit: Payer: Self-pay

## 2020-08-31 ENCOUNTER — Ambulatory Visit
Admission: RE | Admit: 2020-08-31 | Discharge: 2020-08-31 | Disposition: A | Discharge status: Home / Self Care | Payer: Medicare Other | Source: Ambulatory Visit | Attending: Family Medicine | Admitting: Family Medicine

## 2020-08-31 DIAGNOSIS — R928 Other abnormal and inconclusive findings on diagnostic imaging of breast: Secondary | ICD-10-CM

## 2020-10-18 DIAGNOSIS — Z0000 Encounter for general adult medical examination without abnormal findings: Secondary | ICD-10-CM | POA: Diagnosis not present

## 2020-10-18 DIAGNOSIS — E7800 Pure hypercholesterolemia, unspecified: Secondary | ICD-10-CM | POA: Diagnosis not present

## 2020-10-18 DIAGNOSIS — M353 Polymyalgia rheumatica: Secondary | ICD-10-CM | POA: Diagnosis not present

## 2020-10-18 DIAGNOSIS — I10 Essential (primary) hypertension: Secondary | ICD-10-CM | POA: Diagnosis not present

## 2020-10-18 DIAGNOSIS — R7303 Prediabetes: Secondary | ICD-10-CM | POA: Diagnosis not present

## 2020-10-18 DIAGNOSIS — Z1389 Encounter for screening for other disorder: Secondary | ICD-10-CM | POA: Diagnosis not present

## 2020-11-25 DIAGNOSIS — Z961 Presence of intraocular lens: Secondary | ICD-10-CM | POA: Diagnosis not present

## 2020-11-25 DIAGNOSIS — H5213 Myopia, bilateral: Secondary | ICD-10-CM | POA: Diagnosis not present

## 2020-12-05 ENCOUNTER — Ambulatory Visit (INDEPENDENT_AMBULATORY_CARE_PROVIDER_SITE_OTHER): Payer: Medicare Other | Admitting: Internal Medicine

## 2020-12-05 ENCOUNTER — Other Ambulatory Visit: Payer: Self-pay

## 2020-12-05 VITALS — BP 120/64 | HR 71 | Ht 61.5 in | Wt 128.0 lb

## 2020-12-05 DIAGNOSIS — I10 Essential (primary) hypertension: Secondary | ICD-10-CM | POA: Diagnosis not present

## 2020-12-05 DIAGNOSIS — E785 Hyperlipidemia, unspecified: Secondary | ICD-10-CM

## 2020-12-05 DIAGNOSIS — I4510 Unspecified right bundle-branch block: Secondary | ICD-10-CM

## 2020-12-05 MED ORDER — OLMESARTAN MEDOXOMIL 40 MG PO TABS: 40.0000 mg | ORAL_TABLET | Freq: Every day | ORAL | 3 refills | Status: DC

## 2020-12-05 NOTE — Progress Notes (Signed)
Cardiology Office Note:    Date:  12/05/2020   ID:  Kim Moreno, DOB December 17, 1945, MRN [id number]  PCP:  Kim Ada, MD  Cardiologist:  No primary care provider on file.  Electrophysiologist:  None   Referring MD: Kim Ada, MD   Chief Complaint/Reason for Referral: RBBB, HLD, HTN  History of Present Illness:    Kim Moreno is a 75 y.o. female with a history of hypertension, breast cancer with radiation therapy to the left breast in 2008, who presents for evaluation of right bundle branch block. She was planning to have a urologic procedure and a change was noted on her ECG therefore she was referred to cardiology. She previously has seen my colleague Dr. Tollie Eth.   At our last visit: We discussed the generally benign nature of right bundle branch block in the absence of structural heart disease. We do not have an echocardiogram and I would like to perform 1 to evaluate for structural abnormalities that may be contributing. Since she is feeling well, she would like to defer echo at this time. We discussed red flag symptoms to contact our office or present to the ED including dizziness, lightheadedness, syncope, or effort intolerance. She is not currently on lipid-lowering therapy and most recent LDL is 135 with a goal of less than 70.  Would recommend initiation of statin therapy.  Patient defers at this time. We discussed role of calcium scoring for risk stratification.  The patient denies chest pain, chest pressure, dyspnea at rest or with exertion, palpitations, PND, orthopnea, or leg swelling. Denies cough, fever, chills. Denies nausea, vomiting. Denies syncope or presyncope. Denies dizziness or lightheadedness. Denies snoring.   Past Medical History:  Diagnosis Date   Personal history of radiation therapy 2008   left breast    Past Surgical History:  Procedure Laterality Date   BREAST LUMPECTOMY Left 2007    Current Medications: Current Meds  Medication Sig    Alpha-Lipoic Acid 100 MG TABS Take 100 mg by mouth daily. Pt takes 1/2 tab   amLODipine (NORVASC) 10 MG tablet Take 10 mg by mouth daily.    amoxicillin-clavulanate (AUGMENTIN) 875-125 MG tablet Take 1 tablet by mouth 2 (two) times daily. One po bid x 7 days   aspirin 81 MG chewable tablet Chew 81 mg by mouth daily.   Beta Carotene (VITAMIN A) 25000 UNIT capsule Take 7,000 Units by mouth daily.   Co-Enzyme Q-10 100 MG CAPS Take 100 mg by mouth daily. Pt takes 1/2 tab   Copper Gluconate (COPPER CAPS PO) Take 5 mg by mouth daily. Pt takes 1/2 tab   Docosahexaenoic Acid (DHA PO) Take 300 mg by mouth daily. Pt takes 1/2 tab   docusate sodium (COLACE) 100 MG capsule Take 100 mg by mouth daily.   ergocalciferol (DRISDOL) 200 MCG/ML drops Take 4,000 Units by mouth daily.   IRON-VITAMINS PO Take 6 mg by mouth.   LUTEIN PO Take 3 mg by mouth daily. Pt takes 1/2 tab   LYCOPENE PO Take 1 mg by mouth daily. Pt takes 1/2 tab   MAGNESIUM PO Take by mouth. Pt takes 1/2 tablet   MANGANESE PO Take 0.5 mg by mouth.   Milk Thistle-Turmeric (SILYMARIN PO) Take 100 mg by mouth daily. Pt takes 1/2 tab   Misc Natural Products (GREEN FOOD COMPLEX PO) Take 2 capsules by mouth daily.   Multiple Vitamins-Minerals (ZINC PO) Take 15 mg by mouth daily. Pt takes 1/2 tab   NIACIN PO  Take 20 mg by mouth daily. Pt takes 1/2 tab   olmesartan (BENICAR) 20 MG tablet Take 20 mg by mouth daily.    omega-3 acid ethyl esters (LOVAZA) 1 g capsule Take 2 g by mouth daily.   Omega-3 Fatty Acids (EPA PO) Take by mouth.   PANTOTHENIC ACID PO Take 10 mg by mouth. Pt take 1/2 tab   Probiotic Product (PROBIOTIC PO) Take by mouth daily.   Quercetin 50 MG TABS Take 50 mg by mouth daily. Pt takes 1/2 tab   RESVERATROL PO Take 50 mg by mouth daily. Pt takes 1/2 tab   RIBOFLAVIN PO Take 3 mg by mouth daily. Pt takes 1/2 tab   SELENIUM PO Take 70 mcg by mouth daily. Pt takes 1/2 tab   Thiamine HCl (THIAMINE PO) Take 3 mg by mouth daily. Pt  takes 1/2 tab     Allergies:   Arimidex [anastrozole] and Latex      Family History: The patient's family history is not on file.  ROS:   Please see the history of present illness.    All other systems reviewed and are negative.  EKGs/Labs/Other Studies Reviewed:    The following studies were reviewed today:  EKG:  NSR, LAE, RBBB, lateral and inferior infarct pattern.   I have independently reviewed the images from n/a.  Recent Labs: No results found for requested labs within last 8760 hours.  Recent Lipid Panel No results found for: CHOL, TRIG, HDL, CHOLHDL, VLDL, LDLCALC, LDLDIRECT  Physical Exam:    VS:  BP 120/64    Pulse 71    Ht 5' 1.5" (1.562 m)    Wt 128 lb (58.1 kg)    SpO2 95%    BMI 23.79 kg/m     Wt Readings from Last 5 Encounters:  12/05/20 128 lb (58.1 kg)  06/21/20 129 lb (58.5 kg)    Constitutional: No acute distress Eyes: sclera non-icteric, normal conjunctiva and lids ENMT: normal dentition, moist mucous membranes Cardiovascular: regular rhythm, normal rate, no murmurs. S1 and S2 normal. Radial pulses normal bilaterally. No jugular venous distention.  Respiratory: clear to auscultation bilaterally GI : normal bowel sounds, soft and nontender. No distention.   MSK: extremities warm, well perfused. No edema.  NEURO: grossly nonfocal exam, moves all extremities. PSYCH: alert and oriented x 3, normal mood and affect.   ASSESSMENT:    1. Essential hypertension   2. Right bundle branch block   3. Hyperlipidemia, unspecified hyperlipidemia type    PLAN:    Right bundle branch block - Plan: EKG 12-Lead - Stable, consider echo if symptomatic or if RBBB progresses.    Essential hypertension -We discussed goal blood pressure around 120/80, she should continue amlodipine 10 mg daily, and olmesartan 40 mg daily. Refilled today.   Hyperlipidemia, unspecified hyperlipidemia type -She is not currently on lipid-lowering therapy and most recent LDL is 135  with a goal of less than 70.  Will obtain coronary calcium CT to risk stratify.    Total time of encounter: 30 minutes total time of encounter, including 20 minutes spent in face-to-face patient care on the date of this encounter. This time includes coordination of care and counseling regarding above mentioned problem list. Remainder of non-face-to-face time involved reviewing chart documents/testing relevant to the patient encounter and documentation in the medical record. I have independently reviewed documentation from referring provider.   Cherlynn Kaiser, MD, [facility]    Medication Adjustments/Labs and Tests  Ordered: Current medicines are reviewed at length with the patient today.  Concerns regarding medicines are outlined above.   Orders Placed This Encounter  Procedures   CT CARDIAC SCORING (SELF PAY ONLY)   EKG 12-Lead     Meds ordered this encounter  Medications   olmesartan (BENICAR) 40 MG tablet    Sig: Take 1 tablet (40 mg total) by mouth daily.    Dispense:  30 tablet    Refill:  3    Patient Instructions  Medication Instructions:  No Changes In Medications at this time.  *If you need a refill on your cardiac medications before your next appointment, please call your pharmacy*  Testing/Procedures: Dr. Margaretann Loveless has ordered a CT coronary calcium score. This test is done at [address]. This is $99 out of pocket. Coronary CalciumScan A coronary calcium scan is an imaging test used to look for deposits of calcium and other fatty materials (plaques) in the inner lining of the blood vessels of the heart (coronary arteries). These deposits of calcium and plaques can partly clog and narrow the coronary arteries without producing any symptoms or warning signs. This puts a person at risk for a heart attack. This test can detect these deposits before symptoms develop. Tell a health care provider about: Any allergies you have. All  medicines you are taking, including vitamins, herbs, eye drops, creams, and over-the-counter medicines. Any problems you or family members have had with anesthetic medicines. Any blood disorders you have. Any surgeries you have had. Any medical conditions you have. Whether you are pregnant or may be pregnant. What are the risks? Generally, this is a safe procedure. However, problems may occur, including: Harm to a pregnant woman and her unborn baby. This test involves the use of radiation. Radiation exposure can be dangerous to a pregnant woman and her unborn baby. If you are pregnant, you generally should not have this procedure done. Slight increase in the risk of cancer. This is because of the radiation involved in the test. What happens before the procedure? No preparation is needed for this procedure. What happens during the procedure? You will undress and remove any jewelry around your neck or chest. You will put on a hospital gown. Sticky electrodes will be placed on your chest. The electrodes will be connected to an electrocardiogram (ECG) machine to record a tracing of the electrical activity of your heart. A CT scanner will take pictures of your heart. During this time, you will be asked to lie still and hold your breath for 2-3 seconds while a picture of your heart is being taken. The procedure may vary among health care providers and hospitals. What happens after the procedure? You can get dressed. You can return to your normal activities. It is up to you to get the results of your test. Ask your health care provider, or the department that is doing the test, when your results will be ready. Summary A coronary calcium scan is an imaging test used to look for deposits of calcium and other fatty materials (plaques) in the inner lining of the blood vessels of the heart (coronary arteries). Generally, this is a safe procedure. Tell your health care provider if you are pregnant or may  be pregnant. No preparation is needed for this procedure. A CT scanner will take pictures of your heart. You can return to your normal activities after the scan is done. This information is not intended to replace advice given to you  by your health care provider. Make sure you discuss any questions you have with your health care provider. Document Released: 03/08/2008 Document Revised: 07/30/2016 Document Reviewed: 07/30/2016 Elsevier Interactive Patient Education  2017 [place]: At [facility], you and your health needs are our priority.  As part of our continuing mission to provide you with exceptional heart care, we have created designated Provider Care Teams.  These Care Teams include your primary Cardiologist (physician) and Advanced Practice Providers (APPs -  Physician Assistants and Nurse Practitioners) who all work together to provide you with the care you need, when you need it.  Your next appointment:   6 month(s)  The format for your next appointment:   In Person  Provider:   Cherlynn Kaiser, MD

## 2020-12-05 NOTE — Patient Instructions (Signed)
Medication Instructions:  No Changes In Medications at this time.  *If you need a refill on your cardiac medications before your next appointment, please call your pharmacy*  Testing/Procedures: Dr. Margaretann Loveless has ordered a CT coronary calcium score. This test is done at [address]. This is $99 out of pocket. Coronary CalciumScan A coronary calcium scan is an imaging test used to look for deposits of calcium and other fatty materials (plaques) in the inner lining of the blood vessels of the heart (coronary arteries). These deposits of calcium and plaques can partly clog and narrow the coronary arteries without producing any symptoms or warning signs. This puts a person at risk for a heart attack. This test can detect these deposits before symptoms develop. Tell a health care provider about:  Any allergies you have.  All medicines you are taking, including vitamins, herbs, eye drops, creams, and over-the-counter medicines.  Any problems you or family members have had with anesthetic medicines.  Any blood disorders you have.  Any surgeries you have had.  Any medical conditions you have.  Whether you are pregnant or may be pregnant. What are the risks? Generally, this is a safe procedure. However, problems may occur, including:  Harm to a pregnant woman and her unborn baby. This test involves the use of radiation. Radiation exposure can be dangerous to a pregnant woman and her unborn baby. If you are pregnant, you generally should not have this procedure done.  Slight increase in the risk of cancer. This is because of the radiation involved in the test. What happens before the procedure? No preparation is needed for this procedure. What happens during the procedure?  You will undress and remove any jewelry around your neck or chest.  You will put on a hospital gown.  Sticky electrodes will be placed on your chest. The electrodes will be connected to an  electrocardiogram (ECG) machine to record a tracing of the electrical activity of your heart.  A CT scanner will take pictures of your heart. During this time, you will be asked to lie still and hold your breath for 2-3 seconds while a picture of your heart is being taken. The procedure may vary among health care providers and hospitals. What happens after the procedure?  You can get dressed.  You can return to your normal activities.  It is up to you to get the results of your test. Ask your health care provider, or the department that is doing the test, when your results will be ready. Summary  A coronary calcium scan is an imaging test used to look for deposits of calcium and other fatty materials (plaques) in the inner lining of the blood vessels of the heart (coronary arteries).  Generally, this is a safe procedure. Tell your health care provider if you are pregnant or may be pregnant.  No preparation is needed for this procedure.  A CT scanner will take pictures of your heart.  You can return to your normal activities after the scan is done. This information is not intended to replace advice given to you by your health care provider. Make sure you discuss any questions you have with your health care provider. Document Released: 03/08/2008 Document Revised: 07/30/2016 Document Reviewed: 07/30/2016 Elsevier Interactive Patient Education  2017 [place]: At [facility], you and your health needs are our priority.  As part of our continuing mission to provide you with exceptional heart care, we have created designated Provider  Care Teams.  These Care Teams include your primary Cardiologist (physician) and Advanced Practice Providers (APPs -  Physician Assistants and Nurse Practitioners) who all work together to provide you with the care you need, when you need it.  Your next appointment:   6 month(s)  The format for your next appointment:   In Person  Provider:    Cherlynn Kaiser, MD

## 2021-01-04 ENCOUNTER — Ambulatory Visit
Admission: RE | Admit: 2021-01-04 | Discharge: 2021-01-04 | Disposition: A | Discharge status: Home / Self Care | Payer: Self-pay | Source: Ambulatory Visit | Attending: Internal Medicine | Admitting: Internal Medicine

## 2021-01-04 ENCOUNTER — Other Ambulatory Visit: Payer: Self-pay

## 2021-01-04 DIAGNOSIS — I10 Essential (primary) hypertension: Secondary | ICD-10-CM

## 2021-01-10 ENCOUNTER — Telehealth: Payer: Self-pay | Admitting: Internal Medicine

## 2021-01-10 ENCOUNTER — Other Ambulatory Visit: Payer: Self-pay

## 2021-01-10 DIAGNOSIS — Z79899 Other long term (current) drug therapy: Secondary | ICD-10-CM

## 2021-01-10 DIAGNOSIS — E785 Hyperlipidemia, unspecified: Secondary | ICD-10-CM

## 2021-01-10 MED ORDER — ROSUVASTATIN CALCIUM 5 MG PO TABS: 20.0000 mg | ORAL_TABLET | Freq: Every day | ORAL | 3 refills | Status: DC

## 2021-01-10 MED ORDER — ROSUVASTATIN CALCIUM 5 MG PO TABS: 5.0000 mg | ORAL_TABLET | Freq: Every day | ORAL | 3 refills | Status: DC

## 2021-01-10 NOTE — Addendum Note (Signed)
Addended by: Rexanne Mano B on: 01/10/2021 12:31 PM   Modules accepted: Orders

## 2021-01-10 NOTE — Telephone Encounter (Signed)
Returned call to patient, discussed recent Cardiac CT scoring and new prescription Rosuvastatin. Advised patient to call back to office with any issues, questions, or concerns.    Elouise Munroe, MD  01/06/2021 3:57 PM EDT      Elevated coronary calcium score, 231 which is 76th percentile. There is also plaque in the aorta. Recommend we start a statin. We can start with rosuvastatin 5 mg daily, but the goal dose would be 20-40 mg daily. Would recheck lipids and cmp in 3 mo.    Patient verbalized understanding.

## 2021-01-10 NOTE — Telephone Encounter (Signed)
Patient was returning phone call to [name].

## 2021-02-14 DIAGNOSIS — D485 Neoplasm of uncertain behavior of skin: Secondary | ICD-10-CM | POA: Diagnosis not present

## 2021-02-14 DIAGNOSIS — L820 Inflamed seborrheic keratosis: Secondary | ICD-10-CM | POA: Diagnosis not present

## 2021-02-14 DIAGNOSIS — D225 Melanocytic nevi of trunk: Secondary | ICD-10-CM | POA: Diagnosis not present

## 2021-02-14 DIAGNOSIS — L821 Other seborrheic keratosis: Secondary | ICD-10-CM | POA: Diagnosis not present

## 2021-02-14 DIAGNOSIS — L570 Actinic keratosis: Secondary | ICD-10-CM | POA: Diagnosis not present

## 2021-02-14 DIAGNOSIS — Z85828 Personal history of other malignant neoplasm of skin: Secondary | ICD-10-CM | POA: Diagnosis not present

## 2021-02-27 ENCOUNTER — Other Ambulatory Visit: Payer: Self-pay

## 2021-02-27 DIAGNOSIS — Z79899 Other long term (current) drug therapy: Secondary | ICD-10-CM

## 2021-03-22 DIAGNOSIS — Z79899 Other long term (current) drug therapy: Secondary | ICD-10-CM | POA: Diagnosis not present

## 2021-03-22 LAB — COMPREHENSIVE METABOLIC PANEL
ALT: 12 IU/L (ref 0–32)
AST: 15 IU/L (ref 0–40)
Albumin/Globulin Ratio: 1.7 (ref 1.2–2.2)
Albumin: 4.4 g/dL (ref 3.7–4.7)
Alkaline Phosphatase: 68 IU/L (ref 44–121)
BUN/Creatinine Ratio: 23 (ref 12–28)
BUN: 14 mg/dL (ref 8–27)
Bilirubin Total: 0.4 mg/dL (ref 0.0–1.2)
CO2: 22 mmol/L (ref 20–29)
Calcium: 9.5 mg/dL (ref 8.7–10.3)
Chloride: 104 mmol/L (ref 96–106)
Creatinine, Ser: 0.61 mg/dL (ref 0.57–1.00)
Globulin, Total: 2.6 g/dL (ref 1.5–4.5)
Glucose: 74 mg/dL (ref 65–99)
Potassium: 4.3 mmol/L (ref 3.5–5.2)
Sodium: 140 mmol/L (ref 134–144)
Total Protein: 7 g/dL (ref 6.0–8.5)
eGFR: 93 mL/min/{1.73_m2} (ref >59)

## 2021-03-22 LAB — LIPID PANEL
Chol/HDL Ratio: 1.9 ratio (ref 0.0–4.4)
Cholesterol, Total: 210 mg/dL — ABNORMAL HIGH (ref 100–199)
HDL: 109 mg/dL (ref >39)
LDL Chol Calc (NIH): 85 mg/dL (ref 0–99)
Triglycerides: 95 mg/dL (ref 0–149)
VLDL Cholesterol Cal: 16 mg/dL (ref 5–40)

## 2021-03-24 ENCOUNTER — Ambulatory Visit (INDEPENDENT_AMBULATORY_CARE_PROVIDER_SITE_OTHER): Payer: Medicare Other | Admitting: Internal Medicine

## 2021-03-24 ENCOUNTER — Other Ambulatory Visit: Payer: Self-pay

## 2021-03-24 VITALS — BP 122/62 | HR 66 | Ht 62.0 in | Wt 125.4 lb

## 2021-03-24 DIAGNOSIS — Z79899 Other long term (current) drug therapy: Secondary | ICD-10-CM | POA: Diagnosis not present

## 2021-03-24 DIAGNOSIS — E785 Hyperlipidemia, unspecified: Secondary | ICD-10-CM

## 2021-03-24 DIAGNOSIS — I10 Essential (primary) hypertension: Secondary | ICD-10-CM | POA: Diagnosis not present

## 2021-03-24 MED ORDER — ROSUVASTATIN CALCIUM 10 MG PO TABS
10.0000 mg | ORAL_TABLET | Freq: Every day | ORAL | 3 refills | Status: DC
Start: 2021-03-24 — End: 2022-03-22

## 2021-03-24 NOTE — Patient Instructions (Addendum)
Medication Instructions:  INCREASE- CRESTOR (ROSUVASTATIN) TO 10mg  DAILY  *If you need a refill on your cardiac medications before your next appointment, please call your pharmacy*  Lab Work: REPEAT LIPID PANEL IN 4 MONTHS- YOU WILL NEED TO BE FASTING FOR THIS.  If you have labs (blood work) drawn today and your tests are completely normal, you will receive your results only by: [place] (if you have MyChart) OR A paper copy in the mail If you have any lab test that is abnormal or we need to change your treatment, we will call you to review the results.  Follow-Up: At [facility], you and your health needs are our priority.  As part of our continuing mission to provide you with exceptional heart care, we have created designated Provider Care Teams.  These Care Teams include your primary Cardiologist (physician) and Advanced Practice Providers (APPs -  Physician Assistants and Nurse Practitioners) who all work together to provide you with the care you need, when you need it.   Your next appointment:   1 year(s)  The format for your next appointment:   In Person  Provider:   Cherlynn Kaiser, MD

## 2021-03-24 NOTE — Progress Notes (Signed)
Cardiology Office Note:    Date:  03/24/2021   ID:  Kim Moreno, DOB [birth year], MRN [id number]  PCP:  Carol Ada, MD  Cardiologist:  Elouise Munroe, MD  Electrophysiologist:  None   Referring MD: Carol Ada, MD   Chief Complaint/Reason for Referral: RBBB  History of Present Illness:    Kim Moreno is a 75 y.o. female with a history of hypertension, breast cancer with radiation therapy to the left breast in 2008, who presents for evaluation of right bundle branch block. She was planning to have a urologic procedure and a change was noted on her ECG therefore she was referred to cardiology. She previously has seen my colleague Dr. Tollie Eth. Since that time she has lost 25 lbs. She feels well overall.   Today, she feels okay. She says she has found an article indicating Bergamot reduces plaque, and reports that she is now taking this supplement. Every morning she drinks apple or orange juice and adds water. She will then add several essential oils (including 2 drops of Bergamot) in her fluids. I advised her to be cautious with supplements since we do not know how they will be metabolized by the body and how they will interact with her other medications.  While I cannot formally endorse her use of bergamot, 2 drops in her morning beverage is unlikely to cause harm.  I have asked her to send me the NIH article she is referencing and I would be happy to review it.  She takes multiple supplements and I encouraged her to take them with caution.  Previous lab results showed her LDL is 135. Today her LDL is 85 and her HDL is 109. We discussed increasing her Crestor from 5 mg to 10 mg, and she is amenable to this.  We discussed the nonfunctional HDL particles which contribute to her elevated total cholesterol but are unlikely to be of additional benefit from a protective standpoint.  Of note, she has pain in her right pelvic region. She describes the location of her pain as between  her right hip and thigh. After completing some exercises with her yoga instructor her pain resolved.  We discussed that this is unlikely related to statins if it resolved with exercises.  Patient denies chest pain, shortness of breath, palpations, headaches, lightheadedness, LE Edema, syncope, orthopnea or PND.   Past Medical History:  Diagnosis Date   Personal history of radiation therapy 2008   left breast    Past Surgical History:  Procedure Laterality Date   BREAST LUMPECTOMY Left 2007    Current Medications: Current Meds  Medication Sig   Alpha-Lipoic Acid 100 MG TABS Take 100 mg by mouth daily. Pt takes 1/2 tab   amLODipine (NORVASC) 10 MG tablet Take 10 mg by mouth daily.    aspirin 81 MG chewable tablet Chew 81 mg by mouth daily.   Beta Carotene (VITAMIN A) 25000 UNIT capsule Take 7,000 Units by mouth daily.   Co-Enzyme Q-10 100 MG CAPS Take 100 mg by mouth daily. Pt takes 1/2 tab   Copper Gluconate (COPPER CAPS PO) Take 5 mg by mouth daily. Pt takes 1/2 tab   Docosahexaenoic Acid (DHA PO) Take 300 mg by mouth daily. Pt takes 1/2 tab   ergocalciferol (DRISDOL) 200 MCG/ML drops Take 4,000 Units by mouth daily.   IRON-VITAMINS PO Take 6 mg by mouth.   LUTEIN PO Take 3 mg by mouth daily. Pt takes 1/2 tab   LYCOPENE PO  Take 1 mg by mouth daily. Pt takes 1/2 tab   MAGNESIUM PO Take by mouth. Pt takes 1/2 tablet   Milk Thistle-Turmeric (SILYMARIN PO) Take 100 mg by mouth daily. Pt takes 1/2 tab   Misc Natural Products (GREEN FOOD COMPLEX PO) Take 2 capsules by mouth daily.   Multiple Vitamins-Minerals (ZINC PO) Take 15 mg by mouth daily. Pt takes 1/2 tab   NIACIN PO Take 20 mg by mouth daily. Pt takes 1/2 tab   olmesartan (BENICAR) 40 MG tablet Take 1 tablet (40 mg total) by mouth daily.   omega-3 acid ethyl esters (LOVAZA) 1 g capsule Take 2 g by mouth daily.   Omega-3 Fatty Acids (EPA PO) Take by mouth.   PANTOTHENIC ACID PO Take 10 mg by mouth. Pt take 1/2 tab   Probiotic  Product (PROBIOTIC PO) Take by mouth daily.   Quercetin 50 MG TABS Take 50 mg by mouth daily. Pt takes 1/2 tab   RESVERATROL PO Take 50 mg by mouth daily. Pt takes 1/2 tab   SELENIUM PO Take 70 mcg by mouth daily. Pt takes 1/2 tab   Thiamine HCl (THIAMINE PO) Take 3 mg by mouth daily. Pt takes 1/2 tab   [DISCONTINUED] rosuvastatin (CRESTOR) 5 MG tablet Take 1 tablet (5 mg total) by mouth daily.     Allergies:   Arimidex [anastrozole] and Latex        Family History: The patient's family history is not on file.  ROS:   Please see the history of present illness.    (+) Right LE pain, between hip and thigh All other systems reviewed and are negative.  EKGs/Labs/Other Studies Reviewed:    The following studies were reviewed today:  EKG: 03/24/2021: EKG is not ordered today. 12/05/2020:  NSR, LAE, RBBB, ?lateral and inferior infarct  I have independently reviewed the images from CT abdomen pelvis 02/18/2020 without contrast.  Evidence of severe mitral annular calcification, aortic atherosclerosis, probable distal RCA calcification, grossly normal appearance of the pericardium.  Recent Labs: 03/22/2021: ALT 12; BUN 14; Creatinine, Ser 0.61; Potassium 4.3; Sodium 140  Recent Lipid Panel    Component Value Date/Time   CHOL 210 (H) 03/22/2021 1147   TRIG 95 03/22/2021 1147   HDL 109 03/22/2021 1147   CHOLHDL 1.9 03/22/2021 1147   LDLCALC 85 03/22/2021 1147    Physical Exam:    VS:  BP 122/62   Pulse 66   Ht 5\' 2"  (1.575 m)   Wt 125 lb 6.4 oz (56.9 kg)   BMI 22.94 kg/m     Wt Readings from Last 5 Encounters:  03/24/21 125 lb 6.4 oz (56.9 kg)  12/05/20 128 lb (58.1 kg)  06/21/20 129 lb (58.5 kg)    Constitutional: No acute distress Eyes: sclera non-icteric, normal conjunctiva and lids ENMT: normal dentition, moist mucous membranes Cardiovascular: regular rhythm, normal rate, no murmurs. S1 and S2 normal. Radial pulses normal bilaterally. No jugular venous distention.   Respiratory: clear to auscultation bilaterally GI : normal bowel sounds, soft and nontender. No distention.   MSK: extremities warm, well perfused. No edema.  NEURO: grossly nonfocal exam, moves all extremities. PSYCH: alert and oriented x 3, normal mood and affect.   ASSESSMENT:    1. Essential hypertension   2. Hyperlipidemia, unspecified hyperlipidemia type   3. Medication management     PLAN:    Essential hypertension -Her blood pressure is at goal, she should continue amlodipine 10 mg daily, and olmesartan 40 mg  daily.  Hyperlipidemia, unspecified hyperlipidemia type -She has done well on Crestor 5 mg daily, LDL is now 85.  We discussed further aggressive lipid lowering and increasing the dose of her Crestor to 10 mg daily.  She is amenable to trying this.  We will repeat her lipids in the fall.  Polypharmacy with supplements -Patient provides an extensive list of supplements.  Would caution against the use of supplements that are not approved by the FDA in combination with prescription pharmaceuticals.  Optimally, medication should be reviewed with primary care physician frequently to avoid interactions.  Would be happy to offer our pharmacist services if needed to review supplement and medication interactions.  Follow-up: 1 year.  Total time of encounter: 30 minutes total time of encounter, including 20 minutes spent in face-to-face patient care on the date of this encounter. This time includes coordination of care and counseling regarding above mentioned problem list. Remainder of non-face-to-face time involved reviewing chart documents/testing relevant to the patient encounter and documentation in the medical record. I have independently reviewed documentation from referring provider. Approximately 20 pages of outside medical records reviewed in conjunction with this consultation.  Cherlynn Kaiser, MD [place]  [facility]    Medication Adjustments/Labs and Tests  Ordered: Current medicines are reviewed at length with the patient today.  Concerns regarding medicines are outlined above.   Orders Placed This Encounter  Procedures   Lipid panel     Meds ordered this encounter  Medications   rosuvastatin (CRESTOR) 10 MG tablet    Sig: Take 1 tablet (10 mg total) by mouth daily.    Dispense:  90 tablet    Refill:  3     Patient Instructions  Medication Instructions:  INCREASE- CRESTOR (ROSUVASTATIN) TO 10mg  DAILY  *If you need a refill on your cardiac medications before your next appointment, please call your pharmacy*  Lab Work: REPEAT LIPID PANEL IN 4 MONTHS- YOU WILL NEED TO BE FASTING FOR THIS.  If you have labs (blood work) drawn today and your tests are completely normal, you will receive your results only by: [place] (if you have MyChart) OR A paper copy in the mail If you have any lab test that is abnormal or we need to change your treatment, we will call you to review the results.  Follow-Up: At [facility], you and your health needs are our priority.  As part of our continuing mission to provide you with exceptional heart care, we have created designated Provider Care Teams.  These Care Teams include your primary Cardiologist (physician) and Advanced Practice Providers (APPs -  Physician Assistants and Nurse Practitioners) who all work together to provide you with the care you need, when you need it.   Your next appointment:   1 year(s)  The format for your next appointment:   In Person  Provider:   Cherlynn Kaiser, MD    Ardell Isaacs as a scribe for Elouise Munroe, MD.,have documented all relevant documentation on the behalf of Elouise Munroe, MD,as directed by  Elouise Munroe, MD while in the presence of Elouise Munroe, MD.  I, Elouise Munroe, MD, have reviewed all documentation for this visit. The documentation on 03/24/21 for the exam, diagnosis, procedures, and orders are all accurate  and complete.

## 2021-04-18 DIAGNOSIS — R7303 Prediabetes: Secondary | ICD-10-CM | POA: Diagnosis not present

## 2021-04-18 DIAGNOSIS — I10 Essential (primary) hypertension: Secondary | ICD-10-CM | POA: Diagnosis not present

## 2021-06-09 ENCOUNTER — Other Ambulatory Visit: Payer: Self-pay

## 2021-06-09 ENCOUNTER — Ambulatory Visit (INDEPENDENT_AMBULATORY_CARE_PROVIDER_SITE_OTHER): Payer: Medicare Other | Admitting: Internal Medicine

## 2021-06-09 VITALS — BP 110/58 | HR 67 | Resp 20 | Ht 62.0 in | Wt 126.6 lb

## 2021-06-09 DIAGNOSIS — E785 Hyperlipidemia, unspecified: Secondary | ICD-10-CM | POA: Diagnosis not present

## 2021-06-09 DIAGNOSIS — Z79899 Other long term (current) drug therapy: Secondary | ICD-10-CM

## 2021-06-09 DIAGNOSIS — I4510 Unspecified right bundle-branch block: Secondary | ICD-10-CM | POA: Diagnosis not present

## 2021-06-09 DIAGNOSIS — I10 Essential (primary) hypertension: Secondary | ICD-10-CM

## 2021-06-09 MED ORDER — AMLODIPINE BESYLATE 10 MG PO TABS: 5.0000 mg | ORAL_TABLET | Freq: Every day | ORAL | 3 refills | Status: DC

## 2021-06-09 NOTE — Progress Notes (Signed)
Cardiology Office Note:    Date:  06/09/2021   ID:  Kim Moreno, DOB 10-Aug-1946, MRN [id number]  PCP:  Carol Ada, MD  Cardiologist:  Elouise Munroe, MD  Electrophysiologist:  None   Referring MD: Carol Ada, MD   Chief Complaint/Reason for Referral: RBBB, HTN, HLD, CAC  History of Present Illness:    Kim Moreno is a 75 y.o. female with a history of hypertension, breast cancer with radiation therapy to the left breast in 2008, right bundle branch block, HLD, and coronary artery calcifications. She was planning to have a urologic procedure and a change was noted on her ECG therefore she was referred to cardiology. This has since been deferred.  Previous lab results showed her LDL is 135. Now her LDL is 85 and her HDL is 109. We had discussed increasing her Crestor from 5 mg to 10 mg, and she was amenable to this.  We discussed the nonfunctional HDL particles which contribute to her elevated total cholesterol but are unlikely to be of additional benefit from a protective standpoint. She is tolerating crestor 10 mg daily well for a goal LDL <70.  BP particularly diastolic BP is running low. Wonders if she should decrease therapy.  The patient denies chest pain, chest pressure, dyspnea at rest or with exertion, palpitations, PND, orthopnea, or leg swelling. Denies cough, fever, chills. Denies nausea, vomiting. Denies syncope or presyncope. Denies dizziness or lightheadedness. Denies snoring.   Past Medical History:  Diagnosis Date   Personal history of radiation therapy 2008   left breast    Past Surgical History:  Procedure Laterality Date   BREAST LUMPECTOMY Left 2007    Current Medications: Current Meds  Medication Sig   Alpha-Lipoic Acid 100 MG TABS Take 100 mg by mouth daily. Pt takes 1/2 tab   aspirin 81 MG chewable tablet Chew 81 mg by mouth daily.   Beta Carotene (VITAMIN A) 25000 UNIT capsule Take 7,000 Units by mouth daily.   Co-Enzyme Q-10 100 MG CAPS  Take 100 mg by mouth daily. Pt takes 1/2 tab   Copper Gluconate (COPPER CAPS PO) Take 5 mg by mouth daily. Pt takes 1/2 tab   Docosahexaenoic Acid (DHA PO) Take 300 mg by mouth daily. Pt takes 1/2 tab   ergocalciferol (DRISDOL) 200 MCG/ML drops Take 4,000 Units by mouth daily.   IRON-VITAMINS PO Take 6 mg by mouth.   LUTEIN PO Take 3 mg by mouth daily. Pt takes 1/2 tab   LYCOPENE PO Take 1 mg by mouth daily. Pt takes 1/2 tab   MAGNESIUM PO Take by mouth. Pt takes 1/2 tablet   Milk Thistle-Turmeric (SILYMARIN PO) Take 100 mg by mouth daily. Pt takes 1/2 tab   Misc Natural Products (GREEN FOOD COMPLEX PO) Take 2 capsules by mouth daily.   Multiple Vitamins-Minerals (ZINC PO) Take 15 mg by mouth daily. Pt takes 1/2 tab   NIACIN PO Take 20 mg by mouth daily. Pt takes 1/2 tab   olmesartan (BENICAR) 40 MG tablet Take 1 tablet (40 mg total) by mouth daily.   omega-3 acid ethyl esters (LOVAZA) 1 g capsule Take 2 g by mouth daily.   Omega-3 Fatty Acids (EPA PO) Take by mouth.   PANTOTHENIC ACID PO Take 10 mg by mouth. Pt take 1/2 tab   Probiotic Product (PROBIOTIC PO) Take by mouth daily.   Quercetin 50 MG TABS Take 50 mg by mouth daily. Pt takes 1/2 tab   RESVERATROL PO Take  50 mg by mouth daily. Pt takes 1/2 tab   rosuvastatin (CRESTOR) 10 MG tablet Take 1 tablet (10 mg total) by mouth daily.   SELENIUM PO Take 70 mcg by mouth daily. Pt takes 1/2 tab   Thiamine HCl (THIAMINE PO) Take 3 mg by mouth daily. Pt takes 1/2 tab   [DISCONTINUED] amLODipine (NORVASC) 10 MG tablet Take 10 mg by mouth daily.      Allergies:   Arimidex [anastrozole] and Latex       Family History: The patient's family history is not on file.  ROS:   Please see the history of present illness.    All other systems reviewed and are negative.  EKGs/Labs/Other Studies Reviewed:    The following studies were reviewed today:  EKG:  NSR, RBBB, lateral and inferior infarct pattern. Unchanged from prior.   Imaging  studies that I have independently reviewed today: n/a  Recent Labs: 03/22/2021: ALT 12; BUN 14; Creatinine, Ser 0.61; Potassium 4.3; Sodium 140  Recent Lipid Panel    Component Value Date/Time   CHOL 210 (H) 03/22/2021 1147   TRIG 95 03/22/2021 1147   HDL 109 03/22/2021 1147   CHOLHDL 1.9 03/22/2021 1147   LDLCALC 85 03/22/2021 1147    Physical Exam:    VS:  BP (!) 110/58 (BP Location: Left Arm, Patient Position: Sitting, Cuff Size: Normal)   Pulse 67   Resp 20   Ht '5\' 2"'$  (1.575 m)   Wt 126 lb 9.6 oz (57.4 kg)   SpO2 97%   BMI 23.16 kg/m     Wt Readings from Last 5 Encounters:  06/09/21 126 lb 9.6 oz (57.4 kg)  03/24/21 125 lb 6.4 oz (56.9 kg)  12/05/20 128 lb (58.1 kg)  06/21/20 129 lb (58.5 kg)    Constitutional: No acute distress Eyes: sclera non-icteric, normal conjunctiva and lids ENMT: normal dentition, moist mucous membranes Cardiovascular: regular rhythm, normal rate, no murmurs. S1 and S2 normal. Radial pulses normal bilaterally. No jugular venous distention.  Respiratory: clear to auscultation bilaterally GI : normal bowel sounds, soft and nontender. No distention.   MSK: extremities warm, well perfused. No edema.  NEURO: grossly nonfocal exam, moves all extremities. PSYCH: alert and oriented x 3, normal mood and affect.   ASSESSMENT:    1. Essential hypertension   2. Hyperlipidemia, unspecified hyperlipidemia type   3. Medication management   4. Right bundle branch block    PLAN:    Essential hypertension Med mgmt -Her blood pressure is low, she should reduce amlodipine to 5 mg daily, and continue olmesartan 40 mg daily.   Hyperlipidemia, unspecified hyperlipidemia type -She has done well on Crestor 10 mg daily. We will repeat her lipids in the fall.  RBBB - stable.  Total time of encounter: 30 minutes total time of encounter, including 20 minutes spent in face-to-face patient care on the date of this encounter. This time includes coordination of  care and counseling regarding above mentioned problem list. Remainder of non-face-to-face time involved reviewing chart documents/testing relevant to the patient encounter and documentation in the medical record. I have independently reviewed documentation from referring provider.   Cherlynn Kaiser, MD, [place]   Shared Decision Making/Informed Consent:       Medication Adjustments/Labs and Tests Ordered: Current medicines are reviewed at length with the patient today.  Concerns regarding medicines are outlined above.   Orders Placed This Encounter  Procedures   EKG 12-Lead    Meds  ordered this encounter  Medications   amLODipine (NORVASC) 10 MG tablet    Sig: Take 0.5 tablets (5 mg total) by mouth daily.    Dispense:  30 tablet    Refill:  3    Patient Instructions  Medication Instructions:  DECREASE AMLODIPINE TO '5mg'$  DAILY  *If you need a refill on your cardiac medications before your next appointment, please call your pharmacy*  Follow-Up: At [facility], you and your health needs are our priority.  As part of our continuing mission to provide you with exceptional heart care, we have created designated Provider Care Teams.  These Care Teams include your primary Cardiologist (physician) and Advanced Practice Providers (APPs -  Physician Assistants and Nurse Practitioners) who all work together to provide you with the care you need, when you need it.  Your next appointment:   7-8 MONTHS   The format for your next appointment:   In Person  Provider:   Cherlynn Kaiser, MD  PLEASE SEND YOUR BP LOG IN 2-3 [place]

## 2021-06-09 NOTE — Patient Instructions (Signed)
Medication Instructions:  DECREASE AMLODIPINE TO '5mg'$  DAILY  *If you need a refill on your cardiac medications before your next appointment, please call your pharmacy*  Follow-Up: At [facility], you and your health needs are our priority.  As part of our continuing mission to provide you with exceptional heart care, we have created designated Provider Care Teams.  These Care Teams include your primary Cardiologist (physician) and Advanced Practice Providers (APPs -  Physician Assistants and Nurse Practitioners) who all work together to provide you with the care you need, when you need it.  Your next appointment:   7-8 MONTHS   The format for your next appointment:   In Person  Provider:   Cherlynn Kaiser, MD  PLEASE SEND YOUR BP LOG IN 2-3 [place]

## 2021-06-29 ENCOUNTER — Other Ambulatory Visit: Payer: Self-pay

## 2021-06-29 DIAGNOSIS — E785 Hyperlipidemia, unspecified: Secondary | ICD-10-CM

## 2021-06-29 DIAGNOSIS — Z79899 Other long term (current) drug therapy: Secondary | ICD-10-CM

## 2021-07-12 DIAGNOSIS — Z79899 Other long term (current) drug therapy: Secondary | ICD-10-CM | POA: Diagnosis not present

## 2021-07-12 DIAGNOSIS — E785 Hyperlipidemia, unspecified: Secondary | ICD-10-CM | POA: Diagnosis not present

## 2021-07-12 LAB — LIPID PANEL
Chol/HDL Ratio: 1.9 ratio (ref 0.0–4.4)
Cholesterol, Total: 195 mg/dL (ref 100–199)
HDL: 105 mg/dL (ref >39)
LDL Chol Calc (NIH): 78 mg/dL (ref 0–99)
Triglycerides: 67 mg/dL (ref 0–149)
VLDL Cholesterol Cal: 12 mg/dL (ref 5–40)

## 2021-07-20 ENCOUNTER — Other Ambulatory Visit: Payer: Self-pay | Admitting: Family Medicine

## 2021-07-20 DIAGNOSIS — Z1231 Encounter for screening mammogram for malignant neoplasm of breast: Secondary | ICD-10-CM

## 2021-08-07 DIAGNOSIS — L821 Other seborrheic keratosis: Secondary | ICD-10-CM | POA: Diagnosis not present

## 2021-08-07 DIAGNOSIS — Z85828 Personal history of other malignant neoplasm of skin: Secondary | ICD-10-CM | POA: Diagnosis not present

## 2021-08-22 NOTE — Telephone Encounter (Signed)
Please see blood work documentation. This has been addressed. Will close encounter.

## 2021-08-25 ENCOUNTER — Other Ambulatory Visit: Payer: Self-pay

## 2021-08-25 ENCOUNTER — Ambulatory Visit
Admission: RE | Admit: 2021-08-25 | Discharge: 2021-08-25 | Disposition: A | Discharge status: Home / Self Care | Payer: Medicare Other | Source: Ambulatory Visit | Attending: Family Medicine | Admitting: Family Medicine

## 2021-08-25 DIAGNOSIS — Z1231 Encounter for screening mammogram for malignant neoplasm of breast: Secondary | ICD-10-CM

## 2021-08-28 ENCOUNTER — Other Ambulatory Visit: Payer: Self-pay | Admitting: Family Medicine

## 2021-08-28 DIAGNOSIS — N6310 Unspecified lump in the right breast, unspecified quadrant: Secondary | ICD-10-CM

## 2021-08-28 DIAGNOSIS — N644 Mastodynia: Secondary | ICD-10-CM

## 2021-08-31 DIAGNOSIS — Z20822 Contact with and (suspected) exposure to covid-19: Secondary | ICD-10-CM | POA: Diagnosis not present

## 2021-09-08 ENCOUNTER — Ambulatory Visit
Admission: RE | Admit: 2021-09-08 | Discharge: 2021-09-08 | Disposition: A | Discharge status: Home / Self Care | Payer: Medicare Other | Source: Ambulatory Visit | Attending: Family Medicine | Admitting: Family Medicine

## 2021-09-08 ENCOUNTER — Other Ambulatory Visit: Payer: Self-pay | Admitting: Family Medicine

## 2021-09-08 DIAGNOSIS — N644 Mastodynia: Secondary | ICD-10-CM

## 2021-09-08 DIAGNOSIS — R922 Inconclusive mammogram: Secondary | ICD-10-CM | POA: Diagnosis not present

## 2021-09-08 DIAGNOSIS — N6310 Unspecified lump in the right breast, unspecified quadrant: Secondary | ICD-10-CM

## 2021-09-08 DIAGNOSIS — Z853 Personal history of malignant neoplasm of breast: Secondary | ICD-10-CM | POA: Diagnosis not present

## 2021-11-01 DIAGNOSIS — R7303 Prediabetes: Secondary | ICD-10-CM | POA: Diagnosis not present

## 2021-11-01 DIAGNOSIS — E7800 Pure hypercholesterolemia, unspecified: Secondary | ICD-10-CM | POA: Diagnosis not present

## 2021-11-01 DIAGNOSIS — Z1389 Encounter for screening for other disorder: Secondary | ICD-10-CM | POA: Diagnosis not present

## 2021-11-01 DIAGNOSIS — Z0000 Encounter for general adult medical examination without abnormal findings: Secondary | ICD-10-CM | POA: Diagnosis not present

## 2021-11-01 DIAGNOSIS — I10 Essential (primary) hypertension: Secondary | ICD-10-CM | POA: Diagnosis not present

## 2021-11-02 ENCOUNTER — Other Ambulatory Visit: Payer: Self-pay | Admitting: Family Medicine

## 2021-11-02 DIAGNOSIS — E2839 Other primary ovarian failure: Secondary | ICD-10-CM

## 2021-11-09 DIAGNOSIS — U071 COVID-19: Secondary | ICD-10-CM | POA: Diagnosis not present

## 2021-11-20 DIAGNOSIS — Z20822 Contact with and (suspected) exposure to covid-19: Secondary | ICD-10-CM | POA: Diagnosis not present

## 2021-11-29 DIAGNOSIS — H35371 Puckering of macula, right eye: Secondary | ICD-10-CM | POA: Diagnosis not present

## 2021-11-29 DIAGNOSIS — Z961 Presence of intraocular lens: Secondary | ICD-10-CM | POA: Diagnosis not present

## 2021-11-29 DIAGNOSIS — H5213 Myopia, bilateral: Secondary | ICD-10-CM | POA: Diagnosis not present

## 2021-12-05 DIAGNOSIS — Z20822 Contact with and (suspected) exposure to covid-19: Secondary | ICD-10-CM | POA: Diagnosis not present

## 2021-12-24 DIAGNOSIS — Z20822 Contact with and (suspected) exposure to covid-19: Secondary | ICD-10-CM | POA: Diagnosis not present

## 2021-12-28 DIAGNOSIS — Z20822 Contact with and (suspected) exposure to covid-19: Secondary | ICD-10-CM | POA: Diagnosis not present

## 2022-01-08 DIAGNOSIS — Z20822 Contact with and (suspected) exposure to covid-19: Secondary | ICD-10-CM | POA: Diagnosis not present

## 2022-01-29 DIAGNOSIS — Z20822 Contact with and (suspected) exposure to covid-19: Secondary | ICD-10-CM | POA: Diagnosis not present

## 2022-03-12 ENCOUNTER — Ambulatory Visit: Payer: Medicare Other

## 2022-03-12 ENCOUNTER — Ambulatory Visit
Admission: RE | Admit: 2022-03-12 | Discharge: 2022-03-12 | Disposition: A | Discharge status: Home / Self Care | Payer: Medicare Other | Source: Ambulatory Visit | Attending: Family Medicine | Admitting: Family Medicine

## 2022-03-12 DIAGNOSIS — N6310 Unspecified lump in the right breast, unspecified quadrant: Secondary | ICD-10-CM

## 2022-03-12 DIAGNOSIS — Z853 Personal history of malignant neoplasm of breast: Secondary | ICD-10-CM | POA: Diagnosis not present

## 2022-03-16 ENCOUNTER — Other Ambulatory Visit: Payer: Self-pay | Admitting: Internal Medicine

## 2022-03-20 DIAGNOSIS — Z85828 Personal history of other malignant neoplasm of skin: Secondary | ICD-10-CM | POA: Diagnosis not present

## 2022-03-20 DIAGNOSIS — D1801 Hemangioma of skin and subcutaneous tissue: Secondary | ICD-10-CM | POA: Diagnosis not present

## 2022-03-20 DIAGNOSIS — L720 Epidermal cyst: Secondary | ICD-10-CM | POA: Diagnosis not present

## 2022-03-20 DIAGNOSIS — D225 Melanocytic nevi of trunk: Secondary | ICD-10-CM | POA: Diagnosis not present

## 2022-03-20 DIAGNOSIS — L821 Other seborrheic keratosis: Secondary | ICD-10-CM | POA: Diagnosis not present

## 2022-03-20 DIAGNOSIS — L570 Actinic keratosis: Secondary | ICD-10-CM | POA: Diagnosis not present

## 2022-03-20 DIAGNOSIS — D2272 Melanocytic nevi of left lower limb, including hip: Secondary | ICD-10-CM | POA: Diagnosis not present

## 2022-03-22 ENCOUNTER — Other Ambulatory Visit: Payer: Self-pay

## 2022-03-22 MED ORDER — ROSUVASTATIN CALCIUM 10 MG PO TABS: 10.0000 mg | ORAL_TABLET | Freq: Every day | ORAL | 1 refills | Status: DC

## 2022-03-26 ENCOUNTER — Other Ambulatory Visit: Payer: Self-pay | Admitting: Family Medicine

## 2022-03-26 DIAGNOSIS — Z1231 Encounter for screening mammogram for malignant neoplasm of breast: Secondary | ICD-10-CM

## 2022-04-17 ENCOUNTER — Other Ambulatory Visit: Payer: Medicare Other

## 2022-05-01 DIAGNOSIS — E7800 Pure hypercholesterolemia, unspecified: Secondary | ICD-10-CM | POA: Diagnosis not present

## 2022-05-01 DIAGNOSIS — R412 Retrograde amnesia: Secondary | ICD-10-CM | POA: Diagnosis not present

## 2022-05-01 DIAGNOSIS — K5900 Constipation, unspecified: Secondary | ICD-10-CM | POA: Diagnosis not present

## 2022-05-01 DIAGNOSIS — I10 Essential (primary) hypertension: Secondary | ICD-10-CM | POA: Diagnosis not present

## 2022-05-22 DIAGNOSIS — C44629 Squamous cell carcinoma of skin of left upper limb, including shoulder: Secondary | ICD-10-CM | POA: Diagnosis not present

## 2022-05-22 DIAGNOSIS — L570 Actinic keratosis: Secondary | ICD-10-CM | POA: Diagnosis not present

## 2022-05-22 DIAGNOSIS — Z85828 Personal history of other malignant neoplasm of skin: Secondary | ICD-10-CM | POA: Diagnosis not present

## 2022-07-24 ENCOUNTER — Telehealth: Payer: Self-pay | Admitting: Internal Medicine

## 2022-07-24 ENCOUNTER — Ambulatory Visit: Payer: Medicare Other | Attending: Internal Medicine | Admitting: Internal Medicine

## 2022-07-24 ENCOUNTER — Encounter: Payer: Self-pay | Admitting: Internal Medicine

## 2022-07-24 VITALS — BP 100/62 | HR 65 | Ht 60.5 in | Wt 125.2 lb

## 2022-07-24 DIAGNOSIS — I4510 Unspecified right bundle-branch block: Secondary | ICD-10-CM | POA: Diagnosis not present

## 2022-07-24 DIAGNOSIS — Z79899 Other long term (current) drug therapy: Secondary | ICD-10-CM | POA: Diagnosis not present

## 2022-07-24 DIAGNOSIS — E785 Hyperlipidemia, unspecified: Secondary | ICD-10-CM | POA: Insufficient documentation

## 2022-07-24 DIAGNOSIS — R011 Cardiac murmur, unspecified: Secondary | ICD-10-CM | POA: Insufficient documentation

## 2022-07-24 DIAGNOSIS — I10 Essential (primary) hypertension: Secondary | ICD-10-CM | POA: Insufficient documentation

## 2022-07-24 MED ORDER — OLMESARTAN MEDOXOMIL 20 MG PO TABS: 40.0000 mg | ORAL_TABLET | Freq: Every day | ORAL | Status: AC

## 2022-07-24 MED ORDER — AMLODIPINE BESYLATE 10 MG PO TABS: 10.0000 mg | ORAL_TABLET | Freq: Every day | ORAL | 3 refills | Status: AC

## 2022-07-24 NOTE — Telephone Encounter (Signed)
Ok to stay on her current BP regimen and lets call her in 1 mo to review BP readings.  Thanks,  Sherrill    Returned call to patient and made her aware of Dr. Delphina Cahill recommendations. Patient's medication list updated to reflect patients current BP regimen.   Advised patient to call back to office with any issues, questions, or concerns. Patient verbalized understanding.

## 2022-07-24 NOTE — Telephone Encounter (Signed)
Patient called wanting to report BP readings and medications to Dr. Delphina Cahill RN directly.

## 2022-07-24 NOTE — Progress Notes (Signed)
Cardiology Office Note:    Date:  07/24/2022  ID:  Kim Moreno, DOB 12-27-1945, MRN [id number]  PCP:  Carol Ada, MD  Cardiologist:  Elouise Munroe, MD  Electrophysiologist:  None   Referring MD: Carol Ada, MD   Chief Complaint/Reason for Referral: RBBB, HTN, HLD, CAC  History of Present Illness:    Kim Moreno is a 76 y.o. female with a history of hypertension, breast cancer with radiation therapy to the left breast in 2008, right bundle branch block, HLD, and coronary artery calcifications. She was planning to have a urologic procedure and a change was noted on her ECG therefore she was referred to cardiology. This has since been deferred.  At her last visit with me on 06/09/21 she was doing well apart from some concerns with low diastolic blood pressures. Her Amlodipine was decreased to '5mg'$  daily at her last visit. Her LDL was at 85 at that time and has since improved to 78 on 07/12/21.  Today, she states that she has been doing well overall.  She thinks that she was taking Amlodipine '5mg'$  daily for a bit but has been taking a full tablet for quite a while. She is also taking 2 Olmesartan pills. She is unsure of exactly what doses of these medications she is currently taking. She has a blood pressure cuff at home and rarely checks it. She has seen pressures up the 150s at home.  The patient denies chest pain, chest pressure, dyspnea at rest or with exertion, palpitations, PND, orthopnea, or leg swelling. Denies cough, fever, chills. Denies nausea, vomiting. Denies syncope or presyncope. Denies dizziness or lightheadedness. Denies snoring.  She states that her brother, Desiree Lucy, has moved in with her after losing his wife this Summer. She would like for him to establish care with me to follow his CAD s/p stenting.  I would be happy to see him as a new patient.  Past Medical History:  Diagnosis Date   Breast cancer [facility]) 2007   Personal history of radiation  therapy 2008   left breast    Past Surgical History:  Procedure Laterality Date   BREAST LUMPECTOMY Left 2007    Current Medications: Current Meds  Medication Sig   Alpha-Lipoic Acid 100 MG TABS Take 100 mg by mouth daily. Pt takes 1/2 tab   amLODipine (NORVASC) 10 MG tablet Take 0.5 tablets (5 mg total) by mouth daily.   aspirin 81 MG chewable tablet Chew 81 mg by mouth daily.   Beta Carotene (VITAMIN A) 25000 UNIT capsule Take 7,000 Units by mouth daily.   Co-Enzyme Q-10 100 MG CAPS Take 100 mg by mouth daily. Pt takes 1/2 tab   Copper Gluconate (COPPER CAPS PO) Take 5 mg by mouth daily. Pt takes 1/2 tab   Docosahexaenoic Acid (DHA PO) Take 300 mg by mouth daily. Pt takes 1/2 tab   ergocalciferol (DRISDOL) 200 MCG/ML drops Take 4,000 Units by mouth daily.   IRON-VITAMINS PO Take 6 mg by mouth.   LUTEIN PO Take 3 mg by mouth daily. Pt takes 1/2 tab   LYCOPENE PO Take 1 mg by mouth daily. Pt takes 1/2 tab   MAGNESIUM PO Take by mouth. Pt takes 1/2 tablet   Milk Thistle-Turmeric (SILYMARIN PO) Take 100 mg by mouth daily. Pt takes 1/2 tab   Misc Natural Products (GREEN FOOD COMPLEX PO) Take 2 capsules by mouth daily.   Multiple Vitamins-Minerals (ZINC PO) Take 15 mg by mouth daily. Pt takes  1/2 tab   NIACIN PO Take 20 mg by mouth daily. Pt takes 1/2 tab   olmesartan (BENICAR) 40 MG tablet Take 1 tablet (40 mg total) by mouth daily.   omega-3 acid ethyl esters (LOVAZA) 1 g capsule Take 2 g by mouth daily.   Omega-3 Fatty Acids (EPA PO) Take by mouth.   PANTOTHENIC ACID PO Take 10 mg by mouth. Pt take 1/2 tab   Probiotic Product (PROBIOTIC PO) Take by mouth daily.   Quercetin 50 MG TABS Take 50 mg by mouth daily. Pt takes 1/2 tab   RESVERATROL PO Take 50 mg by mouth daily. Pt takes 1/2 tab   rosuvastatin (CRESTOR) 10 MG tablet Take 1 tablet (10 mg total) by mouth daily.   SELENIUM PO Take 70 mcg by mouth daily. Pt takes 1/2 tab   Thiamine HCl (THIAMINE PO) Take 3 mg by mouth daily.  Pt takes 1/2 tab     Allergies:   Arimidex [anastrozole] and Latex       Family History: The patient's family history is not on file.  ROS:   Please see the history of present illness.    All other systems reviewed and are negative.  EKGs/Labs/Other Studies Reviewed:    The following studies were reviewed today:  EKG:  EKG was ordered today. EKG today reviewed and showed sinus rhythm, LA enlargement, and incomplete RBBB. 06/09/2021: NSR, RBBB, lateral and inferior infarct pattern. Unchanged from prior.   Imaging studies that I have independently reviewed today:  Calcium scoring 01/04/2021: FINDINGS: Coronary arteries: Normal origins. Left main: 125 Left anterior descending artery: 71 Left circumflex artery: 0 Right coronary artery: 35 Total: 231 Percentile: 76 Pericardium: Normal. Aorta: Normal caliber of ascending aorta. Aortic atherosclerosis noted. Mitral annular calcification noted.   Recent Labs: No results found for requested labs within last 365 days.  Recent Lipid Panel    Component Value Date/Time   CHOL 195 07/12/2021 1036   TRIG 67 07/12/2021 1036   HDL 105 07/12/2021 1036   CHOLHDL 1.9 07/12/2021 1036   LDLCALC 78 07/12/2021 1036    Physical Exam:    VS:  BP 100/62   Pulse 65   Ht 5' 0.5" (1.537 m)   Wt 125 lb 3.2 oz (56.8 kg)   SpO2 95%   BMI 24.05 kg/m     Wt Readings from Last 5 Encounters:  07/24/22 125 lb 3.2 oz (56.8 kg)  06/09/21 126 lb 9.6 oz (57.4 kg)  03/24/21 125 lb 6.4 oz (56.9 kg)  12/05/20 128 lb (58.1 kg)  06/21/20 129 lb (58.5 kg)    Constitutional: No acute distress Eyes: sclera non-icteric, normal conjunctiva and lids ENMT: normal dentition, moist mucous membranes Cardiovascular: regular rhythm, normal rate, S1 and S2 normal. No jugular venous distention. 2/6 systolic ejection murmur at right upper sternal border.  Respiratory: clear to auscultation bilaterally GI : normal bowel sounds, soft and nontender. No  distention.   MSK: extremities warm, well perfused. No edema.  NEURO: grossly nonfocal exam, moves all extremities. PSYCH: alert and oriented x 3, normal mood and affect.   ASSESSMENT:    1. Murmur, cardiac   2. Medication management   3. Hyperlipidemia, unspecified hyperlipidemia type   4. Essential hypertension   5. Right bundle branch block     PLAN:    Essential hypertension - Her blood pressure is mildly low at 100/62 today. She denies any lightheadedness or dizziness. - She reports high blood pressures up to the 150s  at home but well controlled pressures with her PCP in the 371G systolic. - She suspects that she is taking the full Amlodipine '10mg'$  dose and possible Olmesartan '80mg'$ . She will call our office this afternoon to let use know what doses she has actually been taking. -Post visit, confirmation of medications was performed by Rexanne Mano, RN.  Patient is taking 2 tablets of olmesartan totaling 40 mg a day and amlodipine 10 mg a day.  Blood pressure is mildly elevated at home, we will have her continue on her current regimen and check her blood pressure daily.   Hyperlipidemia, unspecified hyperlipidemia type - She has done well on Crestor 10 mg daily. - LDL of 87, HDL of 118, and Trigs 55 on 11/01/21.   RBBB - Stable.  Systolic murmur - 2/6 systolic murmur auscultated today. - Will order echo to evaluate for valvular disease.  We reviewed that this is likely aortic valve sclerosis or early stenosis given early to mid peaking murmur and unlikely we will have anything actionable after the echo.  We should define this murmur nonetheless.  She is noted to have mitral annular calcification as well but the murmur is clearly loudest at the right upper sternal border and systolic.   Follow up: 1 year  Total time of encounter: 30 minutes total time of encounter, including 20 minutes spent in face-to-face patient care on the date of this encounter. This time includes  coordination of care and counseling regarding above mentioned problem list. Remainder of non-face-to-face time involved reviewing chart documents/testing relevant to the patient encounter and documentation in the medical record. I have independently reviewed documentation from referring provider.   Cherlynn Kaiser, MD, [place]   Shared Decision Making/Informed Consent:       Medication Adjustments/Labs and Tests Ordered: Current medicines are reviewed at length with the patient today.  Concerns regarding medicines are outlined above.   Orders Placed This Encounter  Procedures   EKG 12-Lead   ECHOCARDIOGRAM COMPLETE    No orders of the defined types were placed in this encounter.   Patient Instructions  Medication Instructions:  PLEASE CALL THE OFFICE TO REPORT YOUR CURRENT BLOOD PRESSURE MEDICATIONS [phone] *If you need a refill on your cardiac medications before your next appointment, please call your pharmacy*  Lab Work: None Ordered At This Time.  If you have labs (blood work) drawn today and your tests are completely normal, you will receive your results only by: [place] (if you have MyChart) OR A paper copy in the mail If you have any lab test that is abnormal or we need to change your treatment, we will call you to review the results.  Testing/Procedures: Your physician has requested that you have an echocardiogram. Echocardiography is a painless test that uses sound waves to create images of your heart. It provides your doctor with information about the size and shape of your heart and how well your heart's chambers and valves are working. You may receive an ultrasound enhancing agent through an IV if needed to better visualize your heart during the echo.This procedure takes approximately one hour. There are no restrictions for this procedure. This will take place at the [address].   Follow-Up: At [facility], you and your health needs are our priority.  As part of our continuing mission to provide you with exceptional heart care, we have created designated Provider Care Teams.  These Care Teams include your  primary Cardiologist (physician) and Advanced Practice Providers (APPs -  Physician Assistants and Nurse Practitioners) who all work together to provide you with the care you need, when you need it.  Your next appointment:   1 year(s)  The format for your next appointment:   In Person  Provider:   Elouise Munroe, MD             I,Alexis Herring,acting as a scribe for Elouise Munroe, MD.,have documented all relevant documentation on the behalf of Elouise Munroe, MD,as directed by  Elouise Munroe, MD while in the presence of Elouise Munroe, MD.  I, Elouise Munroe, MD, have reviewed all documentation for this visit. The documentation on 07/24/22 for the exam, diagnosis, procedures, and orders are all accurate and complete.

## 2022-07-24 NOTE — Patient Instructions (Signed)
Medication Instructions:  PLEASE CALL THE OFFICE TO REPORT YOUR CURRENT BLOOD PRESSURE MEDICATIONS [phone] *If you need a refill on your cardiac medications before your next appointment, please call your pharmacy*  Lab Work: None Ordered At This Time.  If you have labs (blood work) drawn today and your tests are completely normal, you will receive your results only by: [place] (if you have MyChart) OR A paper copy in the mail If you have any lab test that is abnormal or we need to change your treatment, we will call you to review the results.  Testing/Procedures: Your physician has requested that you have an echocardiogram. Echocardiography is a painless test that uses sound waves to create images of your heart. It provides your doctor with information about the size and shape of your heart and how well your heart's chambers and valves are working. You may receive an ultrasound enhancing agent through an IV if needed to better visualize your heart during the echo.This procedure takes approximately one hour. There are no restrictions for this procedure. This will take place at the [address].   Follow-Up: At [facility], you and your health needs are our priority.  As part of our continuing mission to provide you with exceptional heart care, we have created designated Provider Care Teams.  These Care Teams include your primary Cardiologist (physician) and Advanced Practice Providers (APPs -  Physician Assistants and Nurse Practitioners) who all work together to provide you with the care you need, when you need it.  Your next appointment:   1 year(s)  The format for your next appointment:   In Person  Provider:   Elouise Munroe, MD

## 2022-07-24 NOTE — Telephone Encounter (Signed)
Returned call to patient - patient states that she is taking Olmesartan- (2) '20mg'$  tablets daily = '40mg'$  total daily.   Patient also reports she is taking Amlodipine (1) '10mg'$  Tablet daily. Patient also reports that she took her BP at home when she returned and reports that it was 145/72 and then 134/67.   Patient states that she continues to get higher blood pressure's on home cuff. Patient reports that blood pressure cuff at home is 81-31 years old.   Advised patient that it might would be a good idea to get updated BP cuff- recommended Omron cuff to patient. Patient states she will get a new one and will start monitoring her BP. Advised patient to let us know how BP is doing with new cuff. Patient aware and verbalized understanding.   Will forward to MD for review and recommendations.

## 2022-08-06 ENCOUNTER — Ambulatory Visit
Admission: RE | Admit: 2022-08-06 | Discharge: 2022-08-06 | Disposition: A | Discharge status: Home / Self Care | Payer: Medicare Other | Source: Ambulatory Visit | Attending: Family Medicine | Admitting: Family Medicine

## 2022-08-06 DIAGNOSIS — E2839 Other primary ovarian failure: Secondary | ICD-10-CM

## 2022-08-06 DIAGNOSIS — M810 Age-related osteoporosis without current pathological fracture: Secondary | ICD-10-CM | POA: Diagnosis not present

## 2022-08-06 DIAGNOSIS — M85851 Other specified disorders of bone density and structure, right thigh: Secondary | ICD-10-CM | POA: Diagnosis not present

## 2022-08-06 DIAGNOSIS — Z780 Asymptomatic menopausal state: Secondary | ICD-10-CM | POA: Diagnosis not present

## 2022-08-07 DIAGNOSIS — H05231 Hemorrhage of right orbit: Secondary | ICD-10-CM | POA: Diagnosis not present

## 2022-08-07 DIAGNOSIS — S058X1A Other injuries of right eye and orbit, initial encounter: Secondary | ICD-10-CM | POA: Diagnosis not present

## 2022-08-07 DIAGNOSIS — W19XXXA Unspecified fall, initial encounter: Secondary | ICD-10-CM | POA: Diagnosis not present

## 2022-08-19 DIAGNOSIS — S0011XA Contusion of right eyelid and periocular area, initial encounter: Secondary | ICD-10-CM | POA: Diagnosis not present

## 2022-08-22 ENCOUNTER — Ambulatory Visit (HOSPITAL_COMMUNITY): Payer: Medicare Other | Attending: Internal Medicine

## 2022-08-22 DIAGNOSIS — R011 Cardiac murmur, unspecified: Secondary | ICD-10-CM | POA: Diagnosis not present

## 2022-08-22 LAB — ECHOCARDIOGRAM COMPLETE
Area-P 1/2: 2.54 cm2
MV VTI: 2.18 cm2
S' Lateral: 2.2 cm

## 2022-08-27 ENCOUNTER — Ambulatory Visit
Admission: RE | Admit: 2022-08-27 | Discharge: 2022-08-27 | Disposition: A | Discharge status: Home / Self Care | Payer: Medicare Other | Source: Ambulatory Visit | Attending: Family Medicine | Admitting: Family Medicine

## 2022-08-27 DIAGNOSIS — Z1231 Encounter for screening mammogram for malignant neoplasm of breast: Secondary | ICD-10-CM

## 2022-09-11 ENCOUNTER — Ambulatory Visit
Admission: RE | Admit: 2022-09-11 | Discharge: 2022-09-11 | Disposition: A | Discharge status: Home / Self Care | Payer: Medicare Other | Source: Ambulatory Visit | Attending: Family Medicine | Admitting: Family Medicine

## 2022-09-11 DIAGNOSIS — Z1231 Encounter for screening mammogram for malignant neoplasm of breast: Secondary | ICD-10-CM | POA: Diagnosis not present

## 2022-09-17 ENCOUNTER — Other Ambulatory Visit: Payer: Self-pay | Admitting: Internal Medicine

## 2022-11-28 DIAGNOSIS — E7800 Pure hypercholesterolemia, unspecified: Secondary | ICD-10-CM | POA: Diagnosis not present

## 2022-11-28 DIAGNOSIS — Z1331 Encounter for screening for depression: Secondary | ICD-10-CM | POA: Diagnosis not present

## 2022-11-28 DIAGNOSIS — Z0000 Encounter for general adult medical examination without abnormal findings: Secondary | ICD-10-CM | POA: Diagnosis not present

## 2022-11-28 DIAGNOSIS — I10 Essential (primary) hypertension: Secondary | ICD-10-CM | POA: Diagnosis not present

## 2022-12-06 DIAGNOSIS — Z961 Presence of intraocular lens: Secondary | ICD-10-CM | POA: Diagnosis not present

## 2022-12-06 DIAGNOSIS — H5213 Myopia, bilateral: Secondary | ICD-10-CM | POA: Diagnosis not present

## 2022-12-06 DIAGNOSIS — H35371 Puckering of macula, right eye: Secondary | ICD-10-CM | POA: Diagnosis not present

## 2022-12-24 DIAGNOSIS — L820 Inflamed seborrheic keratosis: Secondary | ICD-10-CM | POA: Diagnosis not present

## 2022-12-24 DIAGNOSIS — I8312 Varicose veins of left lower extremity with inflammation: Secondary | ICD-10-CM | POA: Diagnosis not present

## 2022-12-24 DIAGNOSIS — I8311 Varicose veins of right lower extremity with inflammation: Secondary | ICD-10-CM | POA: Diagnosis not present

## 2022-12-24 DIAGNOSIS — Z85828 Personal history of other malignant neoplasm of skin: Secondary | ICD-10-CM | POA: Diagnosis not present

## 2022-12-24 DIAGNOSIS — L821 Other seborrheic keratosis: Secondary | ICD-10-CM | POA: Diagnosis not present

## 2022-12-24 DIAGNOSIS — I872 Venous insufficiency (chronic) (peripheral): Secondary | ICD-10-CM | POA: Diagnosis not present

## 2023-03-14 ENCOUNTER — Other Ambulatory Visit: Payer: Self-pay

## 2023-03-14 MED ORDER — ROSUVASTATIN CALCIUM 10 MG PO TABS: 10.0000 mg | ORAL_TABLET | Freq: Every day | ORAL | 1 refills | Status: DC

## 2023-03-20 ENCOUNTER — Telehealth: Payer: Self-pay | Admitting: Internal Medicine

## 2023-03-20 ENCOUNTER — Emergency Department (HOSPITAL_COMMUNITY): Payer: Medicare Other

## 2023-03-20 ENCOUNTER — Other Ambulatory Visit: Payer: Self-pay

## 2023-03-20 ENCOUNTER — Observation Stay (HOSPITAL_COMMUNITY)
Admission: EM | Admit: 2023-03-20 | Discharge: 2023-03-21 | Disposition: A | Discharge status: Home / Self Care | Payer: Medicare Other | Attending: Cardiovascular Disease | Admitting: Cardiovascular Disease

## 2023-03-20 ENCOUNTER — Encounter (HOSPITAL_COMMUNITY): Payer: Self-pay | Admitting: Physician Assistant

## 2023-03-20 DIAGNOSIS — Z79899 Other long term (current) drug therapy: Secondary | ICD-10-CM | POA: Diagnosis not present

## 2023-03-20 DIAGNOSIS — R7989 Other specified abnormal findings of blood chemistry: Secondary | ICD-10-CM

## 2023-03-20 DIAGNOSIS — R0789 Other chest pain: Secondary | ICD-10-CM | POA: Diagnosis not present

## 2023-03-20 DIAGNOSIS — Z853 Personal history of malignant neoplasm of breast: Secondary | ICD-10-CM | POA: Insufficient documentation

## 2023-03-20 DIAGNOSIS — I442 Atrioventricular block, complete: Secondary | ICD-10-CM | POA: Diagnosis not present

## 2023-03-20 DIAGNOSIS — I441 Atrioventricular block, second degree: Secondary | ICD-10-CM | POA: Insufficient documentation

## 2023-03-20 DIAGNOSIS — I959 Hypotension, unspecified: Secondary | ICD-10-CM | POA: Diagnosis not present

## 2023-03-20 DIAGNOSIS — E782 Mixed hyperlipidemia: Secondary | ICD-10-CM | POA: Diagnosis not present

## 2023-03-20 DIAGNOSIS — R079 Chest pain, unspecified: Secondary | ICD-10-CM | POA: Diagnosis not present

## 2023-03-20 DIAGNOSIS — I10 Essential (primary) hypertension: Secondary | ICD-10-CM | POA: Insufficient documentation

## 2023-03-20 LAB — TROPONIN I (HIGH SENSITIVITY)
Troponin I (High Sensitivity): 33 ng/L — ABNORMAL HIGH (ref <18)
Troponin I (High Sensitivity): 33 ng/L — ABNORMAL HIGH (ref <18)

## 2023-03-20 LAB — CBC WITH DIFFERENTIAL/PLATELET
Abs Immature Granulocytes: 0.03 10*3/uL (ref 0.00–0.07)
Basophils Absolute: 0 10*3/uL (ref 0.0–0.1)
Basophils Relative: 0 %
Eosinophils Absolute: 0 10*3/uL (ref 0.0–0.5)
Eosinophils Relative: 1 %
HCT: 41.8 % (ref 36.0–46.0)
Hemoglobin: 14.1 g/dL (ref 12.0–15.0)
Immature Granulocytes: 0 %
Lymphocytes Relative: 19 %
Lymphs Abs: 1.5 10*3/uL (ref 0.7–4.0)
MCH: 33.8 pg (ref 26.0–34.0)
MCHC: 33.7 g/dL (ref 30.0–36.0)
MCV: 100.2 fL — ABNORMAL HIGH (ref 80.0–100.0)
Monocytes Absolute: 0.7 10*3/uL (ref 0.1–1.0)
Monocytes Relative: 8 %
Neutro Abs: 5.6 10*3/uL (ref 1.7–7.7)
Neutrophils Relative %: 72 %
Platelets: 205 10*3/uL (ref 150–400)
RBC: 4.17 MIL/uL (ref 3.87–5.11)
RDW: 13 % (ref 11.5–15.5)
WBC: 7.8 10*3/uL (ref 4.0–10.5)
nRBC: 0 % (ref 0.0–0.2)

## 2023-03-20 LAB — COMPREHENSIVE METABOLIC PANEL
ALT: 16 U/L (ref 0–44)
AST: 22 U/L (ref 15–41)
Albumin: 3.5 g/dL (ref 3.5–5.0)
Alkaline Phosphatase: 59 U/L (ref 38–126)
Anion gap: 13 (ref 5–15)
BUN: 17 mg/dL (ref 8–23)
CO2: 23 mmol/L (ref 22–32)
Calcium: 9.7 mg/dL (ref 8.9–10.3)
Chloride: 101 mmol/L (ref 98–111)
Creatinine, Ser: 0.75 mg/dL (ref 0.44–1.00)
GFR, Estimated: >60 mL/min (ref >60)
Glucose, Bld: 131 mg/dL — ABNORMAL HIGH (ref 70–99)
Potassium: 3.5 mmol/L (ref 3.5–5.1)
Sodium: 137 mmol/L (ref 135–145)
Total Bilirubin: 0.6 mg/dL (ref 0.3–1.2)
Total Protein: 6.4 g/dL — ABNORMAL LOW (ref 6.5–8.1)

## 2023-03-20 LAB — MRSA NEXT GEN BY PCR, NASAL: MRSA by PCR Next Gen: NOT DETECTED

## 2023-03-20 LAB — TSH: TSH: 3.043 u[IU]/mL (ref 0.350–4.500)

## 2023-03-20 MED ORDER — SODIUM CHLORIDE 0.9 % IV SOLN: 250.0000 mL | INTRAVENOUS | Status: DC | PRN

## 2023-03-20 MED ORDER — HEPARIN SODIUM (PORCINE) 5000 UNIT/ML IJ SOLN
5000.0000 [IU] | Freq: Three times a day (TID) | INTRAMUSCULAR | Status: DC
  Administered 2023-03-20 – 2023-03-21 (×2): 5000 [IU] via SUBCUTANEOUS
  Filled 2023-03-20 (×2): qty 1

## 2023-03-20 MED ORDER — ACETAMINOPHEN 325 MG PO TABS: 650.0000 mg | ORAL_TABLET | ORAL | Status: DC | PRN

## 2023-03-20 MED ORDER — ROSUVASTATIN CALCIUM 5 MG PO TABS
10.0000 mg | ORAL_TABLET | Freq: Every day | ORAL | Status: DC
  Administered 2023-03-21: 10 mg via ORAL
  Filled 2023-03-20: qty 2

## 2023-03-20 MED ORDER — SODIUM CHLORIDE 0.9% FLUSH: 3.0000 mL | INTRAVENOUS | Status: DC | PRN

## 2023-03-20 MED ORDER — SODIUM CHLORIDE 0.9 % WEIGHT BASED INFUSION
3.0000 mL/kg/h | INTRAVENOUS | Status: DC
  Administered 2023-03-21: 3 mL/kg/h via INTRAVENOUS

## 2023-03-20 MED ORDER — SODIUM CHLORIDE 0.9% FLUSH
3.0000 mL | Freq: Two times a day (BID) | INTRAVENOUS | Status: DC
  Administered 2023-03-20: 3 mL via INTRAVENOUS

## 2023-03-20 MED ORDER — ASPIRIN 81 MG PO TBEC
81.0000 mg | DELAYED_RELEASE_TABLET | Freq: Every day | ORAL | Status: DC
  Filled 2023-03-20: qty 1

## 2023-03-20 MED ORDER — NITROGLYCERIN 0.4 MG SL SUBL: 0.4000 mg | SUBLINGUAL_TABLET | SUBLINGUAL | Status: DC | PRN

## 2023-03-20 MED ORDER — IRBESARTAN 150 MG PO TABS
300.0000 mg | ORAL_TABLET | Freq: Every day | ORAL | Status: DC
  Administered 2023-03-21: 300 mg via ORAL
  Filled 2023-03-20: qty 2

## 2023-03-20 MED ORDER — ONDANSETRON HCL 4 MG/2ML IJ SOLN: 4.0000 mg | Freq: Four times a day (QID) | INTRAMUSCULAR | Status: DC | PRN

## 2023-03-20 MED ORDER — ASPIRIN 81 MG PO CHEW
81.0000 mg | CHEWABLE_TABLET | ORAL | Status: AC
  Administered 2023-03-21: 81 mg via ORAL
  Filled 2023-03-20: qty 1

## 2023-03-20 MED ORDER — AMLODIPINE BESYLATE 10 MG PO TABS
10.0000 mg | ORAL_TABLET | Freq: Every day | ORAL | Status: DC
  Administered 2023-03-21: 10 mg via ORAL
  Filled 2023-03-20: qty 1

## 2023-03-20 MED ORDER — SODIUM CHLORIDE 0.9 % WEIGHT BASED INFUSION
1.0000 mL/kg/h | INTRAVENOUS | Status: DC
  Administered 2023-03-21: 1 mL/kg/h via INTRAVENOUS

## 2023-03-20 NOTE — ED Notes (Signed)
Pt remains a&ox4, warm and dry to touch. Pt states that she is feeling better and her pain has decreased. She is in the bed with side rail up, covered with a blanket. She is close to RN station should she need anything. She is attached to monitor.

## 2023-03-20 NOTE — Telephone Encounter (Signed)
Patient stated she is having chest pain, dizziness, shortness of breath and sweating. Advised to hang up and call 911.

## 2023-03-20 NOTE — Telephone Encounter (Signed)
Patient's son called and said that the EMS is there and is taking patient to hospital. Did an EKG and it showed patient having a heart block.

## 2023-03-20 NOTE — H&P (Addendum)
Cardiology Admission History and Physical   Patient ID: Kim Moreno MRN: [id number]; DOB: 24-May-1946   Admission date: 03/20/2023  PCP:  Merri Brunette, MD   [facility] Providers Cardiologist:  Parke Poisson, MD        Chief Complaint:  chest pain  Patient Profile:   Kim Moreno is a 77 y.o. female with heart murmur, HTN, HLD, RBBB, coronary artery calcification seen on previous CT, and history of left breast cancer s/p radiation therapy in 2008 who is being seen 03/20/2023 for the evaluation of chest pain and fatigue.  History of Present Illness:   Kim Moreno is a pleasant 77 year old female with past medical history of heart murmur, HTN, HLD, RBBB, coronary artery calcification seen on previous CT, and history of left breast cancer s/p radiation therapy in 2008.  Coronary calcium scoring obtained on 01/04/2021 demonstrated coronary calcium score of 231 which placed the patient on 68 percentile for age and sex matched control, heavily calcified mitral valve annulus.  She was last seen by Dr. Doreene Adas on 07/24/2022 for evaluation of abnormal ECG.  During the visit, was noted she has a 2 out of 6 systolic heart murmur as well.  A subsequent echocardiogram obtained on 08/22/2022 showed EF 60 to 65%, severe mitral annular calcification, mild MR, moderate calcification of the aortic valve with no evidence of aortic stenosis, moderate LAE, normal RV size and pressure.  Patient presented to [facility], ED via EMS on 03/20/2023 for evaluation of dizziness and chest pressure.  She states that she has been having some dizziness while working in front her computer before.  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  She has been noticing intermittent episode of fogginess with her mind recently.  She was working in front of her computer 2 days ago on 6/24 when she started noticing a substernal chest pressure.  It went away by itself after a few  minutes.  She denies any shortness of breath.  Her chest tightness was intermittent, lasting only a few minutes at a time.  Interestingly, she was able to walk to her back yard to turn on the sprinkler yesterday without chest pain.  She says she does not do much strenuous activity. She denies any exacerbating factors behind her chest pressure.  This morning she had recurrent chest pressure in the same spot.  She had a lunch date with her daughter near [facility].  After she got out of the car and was walking toward the restaurant she became very dizzy to the point where she decided to squat down to prevent herself from passing out.  She was able to eventually walk into the restaurant and have lunch with her daughter.  Dizziness went away by itself.  She called our office and spoke to our nurse who instructed her to go to the emergency room.  During EMS transport, it was noted patient was in 2:1 heart block.  While she was in the emergency room, she is mostly in sinus rhythm with heart rate 80s, however has brief episodes of second-degree type I and type II heart block along with 2-1 AV block.  She has not had any dizzy spells in the emergency room despite recurrence of 21 heart block.  She is currently asymptomatic.  Cardiology service consulted for abnormal telemetry.  Chest x-ray is normal.  Blood work is pending.   Past Medical History:  Diagnosis Date   Breast cancer (HCC)  2007   Personal history of radiation therapy 2008   left breast    Past Surgical History:  Procedure Laterality Date   BREAST LUMPECTOMY Left 2007     Medications Prior to Admission: Prior to Admission medications   Medication Sig Start Date End Date Taking? Authorizing Provider  Alpha-Lipoic Acid 100 MG TABS Take 100 mg by mouth daily. Pt takes 1/2 tab    [provider]  amLODipine (NORVASC) 10 MG tablet Take 1 tablet (10 mg total) by mouth daily. 07/24/22   Parke Poisson, MD  aspirin 81 MG chewable  tablet Chew 81 mg by mouth daily.    [provider]  Beta Carotene (VITAMIN A) 25000 UNIT capsule Take 7,000 Units by mouth daily.    [provider]  Co-Enzyme Q-10 100 MG CAPS Take 100 mg by mouth daily. Pt takes 1/2 tab    [provider]  Copper Gluconate (COPPER CAPS PO) Take 5 mg by mouth daily. Pt takes 1/2 tab    [provider]  Docosahexaenoic Acid (DHA PO) Take 300 mg by mouth daily. Pt takes 1/2 tab    [provider]  ergocalciferol (DRISDOL) 200 MCG/ML drops Take 4,000 Units by mouth daily.    [provider]  IRON-VITAMINS PO Take 6 mg by mouth.    [provider]  LUTEIN PO Take 3 mg by mouth daily. Pt takes 1/2 tab    [provider]  LYCOPENE PO Take 1 mg by mouth daily. Pt takes 1/2 tab    [provider]  MAGNESIUM PO Take by mouth. Pt takes 1/2 tablet    [provider]  Milk Thistle-Turmeric (SILYMARIN PO) Take 100 mg by mouth daily. Pt takes 1/2 tab    [provider]  Misc Natural Products (GREEN FOOD COMPLEX PO) Take 2 capsules by mouth daily.    [provider]  Multiple Vitamins-Minerals (ZINC PO) Take 15 mg by mouth daily. Pt takes 1/2 tab    [provider]  NIACIN PO Take 20 mg by mouth daily. Pt takes 1/2 tab    [provider]  olmesartan (BENICAR) 20 MG tablet Take 2 tablets (40 mg total) by mouth daily. 07/24/22   Parke Poisson, MD  omega-3 acid ethyl esters (LOVAZA) 1 g capsule Take 2 g by mouth daily.    [provider]  Omega-3 Fatty Acids (EPA PO) Take by mouth.    [provider]  PANTOTHENIC ACID PO Take 10 mg by mouth. Pt take 1/2 tab    [provider]  Probiotic Product (PROBIOTIC PO) Take by mouth daily.    [provider]  Quercetin 50 MG TABS Take 50 mg by mouth daily. Pt takes 1/2 tab    [provider]  RESVERATROL PO Take 50 mg by mouth daily. Pt takes 1/2 tab    [provider]  rosuvastatin (CRESTOR) 10 MG tablet Take 1 tablet (10 mg total) by mouth daily. 03/14/23   Parke Poisson, MD  SELENIUM PO Take 70 mcg by mouth daily. Pt takes 1/2 tab    [provider]  Thiamine HCl (THIAMINE PO) Take 3 mg by mouth daily. Pt takes 1/2 tab    [provider]     Allergies:    Allergies  Allergen Reactions   Arimidex [Anastrozole] Rash   Latex Rash    Social History:   Social History   Socioeconomic History   Marital status:  Divorced    Spouse name: Not on file   Number of children: Not on file   Years of education: Not on file   Highest education level: Not on file  Occupational History   Not on file  Tobacco Use   Smoking status: Not on file   Smokeless tobacco: Not on file  Substance and Sexual Activity   Alcohol use: Not on file   Drug use: Not on file   Sexual activity: Not on file  Other Topics Concern   Not on file  Social History Narrative   Not on file   Social Determinants of Health   Financial Resource Strain: Not on file  Food Insecurity: Not on file  Transportation Needs: Not on file  Physical Activity: Not on file  Stress: Not on file  Social Connections: Not on file  Intimate Partner Violence: Not on file    Family History:   The patient's family history includes Heart attack in her brother.    ROS:  Please see the history of present illness.  All other ROS reviewed and negative.     Physical Exam/Data:   Vitals:   03/20/23 1543 03/20/23 1545 03/20/23 1655 03/20/23 1658  BP:   124/68   Pulse:   87   Resp:   15   Temp:    98 F (36.7 C)  TempSrc:    Oral  SpO2: 98%  96%   Weight:  54.9 kg    Height:  5\' 1"  (1.549 m)     No intake or output data in the 24 hours ending 03/20/23 1801    03/20/2023    3:45 PM 07/24/2022    8:34 AM 06/09/2021   11:27 AM  Last 3 Weights  Weight (lbs) 121 lb 125 lb 3.2 oz 126 lb 9.6 oz  Weight (kg) 54.885 kg 56.79 kg 57.425 kg     Body mass index is  22.86 kg/m.  General:  Well nourished, well developed, in no acute distress HEENT: normal Neck: no JVD Vascular: No carotid bruits; Distal pulses 2+ bilaterally   Cardiac:  normal S1, S2; RRR; no murmur  Lungs:  clear to auscultation bilaterally, no wheezing, rhonchi or rales  Abd: soft, nontender, no hepatomegaly  Ext: no edema Musculoskeletal:  No deformities, BUE and BLE strength normal and equal Skin: warm and dry  Neuro:  CNs 2-12 intact, no focal abnormalities noted Psych:  Normal affect    EKG:  The ECG that was done and was personally reviewed and demonstrates sinus with wenckebach  Relevant CV Studies:  Echo 08/22/2022  1. Left ventricular ejection fraction, by estimation, is 60 to 65%. The  left ventricle has normal function. The left ventricle has no regional  wall motion abnormalities. Left ventricular diastolic parameters are  indeterminate. The average left  ventricular global longitudinal strain is -25.3 %. The global longitudinal  strain is normal.   2. Right ventricular systolic function is normal. The right ventricular  size is normal. There is normal pulmonary artery systolic pressure.   3. Left atrial size was moderately dilated.   4. The mitral valve is degenerative. Mild mitral valve regurgitation. No  evidence of mitral stenosis. Severe mitral annular calcification.   5. The aortic valve is tricuspid. There is moderate calcification of the  aortic valve. There is moderate thickening of the aortic valve. Aortic  valve regurgitation is not visualized. Aortic valve sclerosis is present,  with no evidence of aortic valve  stenosis.  6. The inferior vena cava is normal in size with greater than 50%  respiratory variability, suggesting right atrial pressure of 3 mmHg.   Laboratory Data:  High Sensitivity Troponin:  No results for input(s): "TROPONINIHS" in the last 720 hours.    ChemistryNo results for input(s): "NA", "K", "CL", "CO2", "GLUCOSE", "BUN",  "CREATININE", "CALCIUM", "MG", "GFRNONAA", "GFRAA", "ANIONGAP" in the last 168 hours.  No results for input(s): "PROT", "ALBUMIN", "AST", "ALT", "ALKPHOS", "BILITOT" in the last 168 hours. Lipids No results for input(s): "CHOL", "TRIG", "HDL", "LABVLDL", "LDLCALC", "CHOLHDL" in the last 168 hours. Hematology Recent Labs  Lab 03/20/23 1657  WBC 7.8  RBC 4.17  HGB 14.1  HCT 41.8  MCV 100.2*  MCH 33.8  MCHC 33.7  RDW 13.0  PLT 205   Thyroid No results for input(s): "TSH", "FREET4" in the last 168 hours. BNPNo results for input(s): "BNP", "PROBNP" in the last 168 hours.  DDimer No results for input(s): "DDIMER" in the last 168 hours.   Radiology/Studies:  DG Chest 2 View  Result Date: 03/20/2023 CLINICAL DATA:  Chest pain. EXAM: CHEST - 2 VIEW COMPARISON:  None Available. FINDINGS: The heart size and mediastinal contours are within normal limits. Both lungs are clear. The visualized skeletal structures are unremarkable. IMPRESSION: No active cardiopulmonary disease. Electronically Signed   By: Lupita Raider M.D.   On: 03/20/2023 16:24     Assessment and Plan:   Intermittent chest pain: Prior history of coronary artery calcification with elevated calcium score.  She had intermittent chest pain the day before yesterday and also this morning.  She did not have any chest pain yesterday.  Symptom is intermittent lasting only for few minutes, no exacerbating factors.  Pending blood work.  She is a poor candidate for Myoview/Lexiscan given heart block, her baseline heart rate is not slow enough for coronary CT either.  Will discuss with MD, may need cardiac catheterization.  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  Earlier today, while getting out of the car to walk to the restaurant to have lunch with her daughter, she had a significant episode of dizziness and had to squat down on the ground to prevent herself from  passing out.  This is the most significant dizziness she had recently.  She has been describing some fogginess in her mind while sitting in front of the computer recently.  She is not on any AV nodal blocking agent.  She will need ischemic workup, if negative, she she will need to see electrophysiology service  Hypertension: On amlodipine 10 mg and olmesartan 40 mg at home  Hyperlipidemia: On Crestor 10 mg daily  RBBB   Risk Assessment/Risk Scores:    TIMI Risk Score for Unstable Angina or Non-ST Elevation MI:   The patient's TIMI risk score is 3, which indicates a 13% risk of all cause mortality, new or recurrent myocardial infarction or need for urgent revascularization in the next 14 days.       Severity of Illness: The appropriate patient status for this patient is OBSERVATION. Observation status is judged to be reasonable and necessary in order to provide the required intensity of service to ensure the patient's safety. The patient's presenting symptoms, physical exam findings, and initial radiographic and laboratory data in the context of their medical condition is felt to place them at decreased risk for further clinical deterioration. Furthermore, it is anticipated that the patient will be  medically stable for discharge from the hospital within 2 midnights of admission.    For questions or updates, please contact [facility] Please consult [email or web address] for contact info under     Ramond Dial, [place]  03/20/2023 6:01 PM   Patient seen, examined. Available data reviewed. Agree with findings, assessment, and plan as outlined by Azalee Course, PA. The patient is independently interviewed and examined. Her daughter is at the bedside. On my exam: Vitals:   03/20/23 1935 03/20/23 2034  BP: 123/61 (!) 146/74  Pulse: 70 71  Resp: 14 14  Temp: 98.2 F (36.8 C) 97.8 F (36.6 C)  SpO2: 94%    Pt is alert and oriented, NAD HEENT: normal Neck: JVP - normal, carotids 2+= with  soft bilateral bruits Lungs: CTA bilaterally CV: RRR with 2/6 SEM at the RUSB Abd: soft, NT, Positive BS, no hepatomegaly Ext: no C/C/E, distal pulses intact and equal Skin: warm/dry no rash  NSR with RBBB, second degree type I AV block. Troponin elevated at 33 x 2. CXR clear.   Pt with transient chest discomfort at rest today, also with an episode of near syncope. She has had 2 falls at night but she doesn't remember specific details. States that she 'sleep walks.' Unclear if cardiac arrhythmia/AV block is playing a role in her symptoms. 2:1 AV block occurred en rout today per EMS. As outlined above, not an ideal candidate for stress testing, and with mildly elevated enzymes, favor definitive evaluation with cardiac cath and possible PCI. This will evaluate for the potential of RCA-territory ischemia as an etiology of AV block. I have reviewed the risks, indications, and alternatives to cardiac catheterization, possible angioplasty, and stenting with the patient. Risks include but are not limited to bleeding, infection, vascular injury, stroke, myocardial infection, arrhythmia, kidney injury, radiation-related injury in the case of prolonged fluoroscopy use, emergency cardiac surgery, and death. The patient understands the risks of serious complication is 1-2 in 1000 with diagnostic cardiac cath and 1-2% or less with angioplasty/stenting. Pending cath results ,will review her case with our EP team.    Tonny Bollman, M.D. 03/20/2023 9:33 PM

## 2023-03-20 NOTE — ED Notes (Addendum)
ED TO INPATIENT HANDOFF REPORT  ED Nurse Name and Phone #: Juliette Alcide RN [phone]  S Name/Age/Gender Kim Moreno 77 y.o. female Room/Bed: 034C/034C  Code Status   Code Status: Full Code  Home/SNF/Other Home Patient oriented to: self, place, time, and situation Is this baseline? Yes   Triage Complete: Triage complete  Chief Complaint Chest pain [R07.9]  Triage Note No notes on file   Allergies Allergies  Allergen Reactions   Arimidex [Anastrozole] Rash   Latex Rash    Level of Care/Admitting Diagnosis ED Disposition     ED Disposition  Admit   Condition  --   Comment  Hospital Area: [facility] [[id number]]  Level of Care: Telemetry Cardiac [103]  May place patient in observation at [facility] or [facility] if equivalent level of care is available:: No  Covid Evaluation: Asymptomatic - no recent exposure (last 10 days) testing not required  Diagnosis: Chest pain [528413]  Admitting Physician: Tonny Bollman [[id number]]  Attending Physician: Tonny Bollman [[id number]]          B Medical/Surgery History Past Medical History:  Diagnosis Date   Breast cancer (HCC) 2007   Personal history of radiation therapy 2008   left breast   Past Surgical History:  Procedure Laterality Date   BREAST LUMPECTOMY Left 2007     A IV Location/Drains/Wounds Patient Lines/Drains/Airways Status     Active Line/Drains/Airways     Name Placement date Placement time Site Days   Peripheral IV 03/20/23 18 G Left Antecubital 03/20/23  --  Antecubital  less than 1            Intake/Output Last 24 hours No intake or output data in the 24 hours ending 03/20/23 1912  Labs/Imaging Results for orders placed or performed during the hospital encounter of 03/20/23 (from the past 48 hour(s))  Comprehensive metabolic panel     Status: Abnormal   Collection Time: 03/20/23  4:57 PM  Result Value Ref Range   Sodium 137 135 - 145 mmol/L   Potassium 3.5 3.5 - 5.1 mmol/L    Chloride 101 98 - 111 mmol/L   CO2 23 22 - 32 mmol/L   Glucose, Bld 131 (H) 70 - 99 mg/dL    Comment: Glucose reference range applies only to samples taken after fasting for at least 8 hours.   BUN 17 8 - 23 mg/dL   Creatinine, Ser 2.44 0.44 - 1.00 mg/dL   Calcium 9.7 8.9 - 01.0 mg/dL   Total Protein 6.4 (L) 6.5 - 8.1 g/dL   Albumin 3.5 3.5 - 5.0 g/dL   AST 22 15 - 41 U/L   ALT 16 0 - 44 U/L   Alkaline Phosphatase 59 38 - 126 U/L   Total Bilirubin 0.6 0.3 - 1.2 mg/dL   GFR, Estimated >27 >25 mL/min    Comment: (NOTE) Calculated using the CKD-EPI Creatinine Equation (2021)    Anion gap 13 5 - 15    Comment: Performed at [facility], [address], [place], [place] [address]  Troponin I (High Sensitivity)     Status: Abnormal   Collection Time: 03/20/23  4:57 PM  Result Value Ref Range   Troponin I (High Sensitivity) 33 (H) <18 ng/L    Comment: (NOTE) Elevated high sensitivity troponin I (hsTnI) values and significant  changes across serial measurements may suggest ACS but many other  chronic and acute conditions are known to elevate hsTnI results.  Refer to the "Links"  section for chest pain algorithms and additional  guidance. Performed at [facility], [address] [place], [place] [address]   CBC with Differential     Status: Abnormal   Collection Time: 03/20/23  4:57 PM  Result Value Ref Range   WBC 7.8 4.0 - 10.5 K/uL   RBC 4.17 3.87 - 5.11 MIL/uL   Hemoglobin 14.1 12.0 - 15.0 g/dL   HCT 13.2 44.0 - 10.2 %   MCV 100.2 (H) 80.0 - 100.0 fL   MCH 33.8 26.0 - 34.0 pg   MCHC 33.7 30.0 - 36.0 g/dL   RDW 72.5 36.6 - 44.0 %   Platelets 205 150 - 400 K/uL   nRBC 0.0 0.0 - 0.2 %   Neutrophils Relative % 72 %   Neutro Abs 5.6 1.7 - 7.7 K/uL   Lymphocytes Relative 19 %   Lymphs Abs 1.5 0.7 - 4.0 K/uL   Monocytes Relative 8 %   Monocytes Absolute 0.7 0.1 - 1.0 K/uL   Eosinophils Relative 1 %   Eosinophils Absolute 0.0 0.0 - 0.5 K/uL   Basophils Relative  0 %   Basophils Absolute 0.0 0.0 - 0.1 K/uL   Immature Granulocytes 0 %   Abs Immature Granulocytes 0.03 0.00 - 0.07 K/uL    Comment: Performed at [facility], [address] [place], [place] [address]   DG Chest 2 View  Result Date: 03/20/2023 CLINICAL DATA:  Chest pain. EXAM: CHEST - 2 VIEW COMPARISON:  None Available. FINDINGS: The heart size and mediastinal contours are within normal limits. Both lungs are clear. The visualized skeletal structures are unremarkable. IMPRESSION: No active cardiopulmonary disease. Electronically Signed   By: Lupita Raider M.D.   On: 03/20/2023 16:24    Pending Labs Unresulted Labs (From admission, onward)     Start     Ordered   03/20/23 1752  TSH  Add-on,   AD        03/20/23 1751   Signed and Held  Lipoprotein A (LPA)  Tomorrow morning,   R        Signed and Held   Signed and Held  CBC  (heparin)  Once,   R       Comments: Baseline for heparin therapy IF NOT ALREADY DRAWN.  Notify MD if PLT < 100 K.    Signed and Held   Signed and Held  Creatinine, serum  (heparin)  Once,   R       Comments: Baseline for heparin therapy IF NOT ALREADY DRAWN.    Signed and Held   Signed and Held  Basic metabolic panel  Tomorrow morning,   R        Signed and Held   Signed and Held  Lipid panel  Tomorrow morning,   R        Signed and Held            Vitals/Pain Today's Vitals   03/20/23 1544 03/20/23 1545 03/20/23 1655 03/20/23 1658  BP:   124/68   Pulse:   87   Resp:   15   Temp:    98 F (36.7 C)  TempSrc:    Oral  SpO2:   96%   Weight:  54.9 kg    Height:  5\' 1"  (1.549 m)    PainSc: 5        Isolation Precautions No active isolations  Medications Medications - No data to display  Mobility walks  Focused Assessments Neuro Assessment Handoff:   Cardiac Rhythm:  (BBB)      R Recommendations: See Admitting Provider Note  Report given to:   Additional Notes: Pt A&Ox4. Ambulatory and continent. Pt was in second  degree Wenckebach on arrival; currently in SR with BBB.

## 2023-03-20 NOTE — ED Provider Notes (Signed)
[place] EMERGENCY DEPARTMENT AT [facility] Provider Note  CSN: [id number] Arrival date & time: 03/20/23 1537  Chief Complaint(s) Chest Pain (Pt coming from home via EMS for chest pressure and feeling sluggish for 2 days. EMS found pt to be in a heart block. )  HPI Kim Moreno is a 77 y.o. female who presents emergency room for evaluation of chest pain and fatigue.  She states that for the last 48 hours she has had intermittent episodes of chest pressure and generalized fatigue.  EMS was called today and EKGs in the outpatient setting are concerning for a Mobitz type II heart block.  Patient arrives hemodynamically stable stating symptoms have mostly improved here in the emergency department.  Currently denies shortness of breath, abdominal pain, nausea, vomiting, diaphoresis or other systemic symptoms.   Past Medical History Past Medical History:  Diagnosis Date   Breast cancer [facility]) 2007   Personal history of radiation therapy 2008   left breast   There are no problems to display for this patient.  Home Medication(s) Prior to Admission medications   Medication Sig Start Date End Date Taking? Authorizing Provider  Alpha-Lipoic Acid 100 MG TABS Take 100 mg by mouth daily. Pt takes 1/2 tab    [provider]  amLODipine (NORVASC) 10 MG tablet Take 1 tablet (10 mg total) by mouth daily. 07/24/22   Parke Poisson, MD  aspirin 81 MG chewable tablet Chew 81 mg by mouth daily.    [provider]  Beta Carotene (VITAMIN A) 25000 UNIT capsule Take 7,000 Units by mouth daily.    [provider]  Co-Enzyme Q-10 100 MG CAPS Take 100 mg by mouth daily. Pt takes 1/2 tab    [provider]  Copper Gluconate (COPPER CAPS PO) Take 5 mg by mouth daily. Pt takes 1/2 tab    [provider]  Docosahexaenoic Acid (DHA PO) Take 300 mg by mouth daily. Pt takes 1/2 tab    [provider]  ergocalciferol (DRISDOL) 200 MCG/ML drops Take  4,000 Units by mouth daily.    [provider]  IRON-VITAMINS PO Take 6 mg by mouth.    [provider]  LUTEIN PO Take 3 mg by mouth daily. Pt takes 1/2 tab    [provider]  LYCOPENE PO Take 1 mg by mouth daily. Pt takes 1/2 tab    [provider]  MAGNESIUM PO Take by mouth. Pt takes 1/2 tablet    [provider]  Milk Thistle-Turmeric (SILYMARIN PO) Take 100 mg by mouth daily. Pt takes 1/2 tab    [provider]  Misc Natural Products (GREEN FOOD COMPLEX PO) Take 2 capsules by mouth daily.    [provider]  Multiple Vitamins-Minerals (ZINC PO) Take 15 mg by mouth daily. Pt takes 1/2 tab    [provider]  NIACIN PO Take 20 mg by mouth daily. Pt takes 1/2 tab    [provider]  olmesartan (BENICAR) 20 MG tablet Take 2 tablets (40 mg total) by mouth daily. 07/24/22   Parke Poisson, MD  omega-3 acid ethyl esters (LOVAZA) 1 g capsule Take 2 g by mouth daily.    [provider]  Omega-3 Fatty Acids (EPA PO) Take by mouth.    [provider]  PANTOTHENIC ACID PO Take 10 mg by mouth. Pt take 1/2 tab    [provider]  Probiotic Product (PROBIOTIC PO) Take by mouth daily.  [provider]  Quercetin 50 MG TABS Take 50 mg by mouth daily. Pt takes 1/2 tab    [provider]  RESVERATROL PO Take 50 mg by mouth daily. Pt takes 1/2 tab    [provider]  rosuvastatin (CRESTOR) 10 MG tablet Take 1 tablet (10 mg total) by mouth daily. 03/14/23   Parke Poisson, MD  SELENIUM PO Take 70 mcg by mouth daily. Pt takes 1/2 tab    [provider]  Thiamine HCl (THIAMINE PO) Take 3 mg by mouth daily. Pt takes 1/2 tab    [provider]                                                                                                                                    Past Surgical History Past Surgical History:  Procedure Laterality Date   BREAST  LUMPECTOMY Left 2007   Family History No family history on file.  Social History   Allergies Arimidex [anastrozole] and Latex  Review of Systems Review of Systems  Constitutional:  Positive for diaphoresis.  Respiratory:  Positive for shortness of breath.   Cardiovascular:  Positive for chest pain.  Neurological:  Positive for dizziness.    Physical Exam Vital Signs  I have reviewed the triage vital signs Ht 5\' 1"  (1.549 m)   Wt 54.9 kg   SpO2 98%   BMI 22.86 kg/m   Physical Exam Vitals and nursing note reviewed.  Constitutional:      General: She is not in acute distress.    Appearance: She is well-developed.  HENT:     Head: Normocephalic and atraumatic.  Eyes:     Conjunctiva/sclera: Conjunctivae normal.  Cardiovascular:     Rate and Rhythm: Normal rate. Rhythm irregular.     Heart sounds: No murmur heard. Pulmonary:     Effort: Pulmonary effort is normal. No respiratory distress.     Breath sounds: Normal breath sounds.  Abdominal:     Palpations: Abdomen is soft.     Tenderness: There is no abdominal tenderness.  Musculoskeletal:        General: No swelling.     Cervical back: Neck supple.  Skin:    General: Skin is warm and dry.     Capillary Refill: Capillary refill takes less than 2 seconds.  Neurological:     Mental Status: She is alert.  Psychiatric:        Mood and Affect: Mood normal.     ED Results and Treatments Labs (all labs ordered are listed, but only abnormal results are displayed) Labs Reviewed  COMPREHENSIVE METABOLIC PANEL  CBC WITH DIFFERENTIAL/PLATELET  TROPONIN I (HIGH SENSITIVITY)  Radiology No results found.  Pertinent labs & imaging results that were available during my care of the patient were reviewed by me and considered in my medical decision making (see MDM for details).  Medications Ordered in  ED Medications - No data to display                                                                                                                                   Procedures Procedures  (including critical care time)  Medical Decision Making / ED Course   This patient presents to the ED for concern of chest pain, fatigue, palpitations, this involves an extensive number of treatment options, and is a complaint that carries with it a high risk of complications and morbidity.  The differential diagnosis includes dysrhythmia, ACS, Aortic Dissection, Pneumothorax, Pneumonia, Esophageal Rupture, PE, Tamponade/Pericardial Effusion, pericarditis, esophageal spasm, dysrhythmia, GERD, costochondritis.  MDM: Patient seen emergency room for evaluation of chest pain and fatigue.  Physical exam with an irregular rhythm but is otherwise unremarkable.  Laboratory evaluation largely unremarkable outside of an initial high-sensitivity troponin of 33 but delta troponin is 33.  Initial ECG was unremarkable but follow-up EKG today showing Mobitz heart block.  Cardiology was consulted who came to evaluate the patient at bedside and will admit the patient to their service.  Patient then admitted to cardiology   Additional history obtained: -Additional history obtained from family -External records from outside source obtained and reviewed including: Chart review including previous notes, labs, imaging, consultation notes   Lab Tests: -I ordered, reviewed, and interpreted labs.   The pertinent results include:   Labs Reviewed  COMPREHENSIVE METABOLIC PANEL  CBC WITH DIFFERENTIAL/PLATELET  TROPONIN I (HIGH SENSITIVITY)      EKG   EKG Interpretation  Date/Time:  Wednesday March 20 2023 15:55:04 EDT Ventricular Rate:  66 PR Interval:  166 QRS Duration: 124 QT Interval:  455 QTC Calculation: 394 R Axis:   -28 Text Interpretation: Sinus bradycardia Second deg AVB, Mobitz I (Wenckebach) Right bundle  branch block Confirmed by Monifah Freehling ([id number]) on 03/20/2023 4:07:44 PM         Imaging Studies ordered: I ordered imaging studies including chest x-ray I independently visualized and interpreted imaging. I agree with the radiologist interpretation   Medicines ordered and prescription drug management: No orders of the defined types were placed in this encounter.   -I have reviewed the patients home medicines and have made adjustments as needed  Critical interventions none  Consultations Obtained: I requested consultation with the cardiology team on-call,  and discussed lab and imaging findings as well as pertinent plan - they recommend: Cardiology admission   Cardiac Monitoring: The patient was maintained on a cardiac monitor.  I personally viewed and interpreted the cardiac monitored which showed an underlying rhythm of: Sinus bradycardia with second-degree heart block  Social Determinants of Health:  Factors impacting patients care include: none  Reevaluation: After the interventions noted above, I reevaluated the patient and found that they have :stayed the same  Co morbidities that complicate the patient evaluation  Past Medical History:  Diagnosis Date   Breast cancer ([facility]) 2007   Personal history of radiation therapy 2008   left breast      Dispostion: I considered admission for this patient, and due to secondary heart block patient require hospital admission     Final Clinical Impression(s) / ED Diagnoses Final diagnoses:  None     @PCDICTATION @    Glendora Score, MD 03/21/23 1212

## 2023-03-20 NOTE — Telephone Encounter (Signed)
Pt c/o of Chest Pain: STAT if CP now or developed within 24 hours  1. Are you having CP right now? Chest tightness yes  2. Are you experiencing any other symptoms (ex. SOB, nausea, vomiting, sweating)? SOB, Dizziness and indigestion    3. How long have you been experiencing CP? Started 2 days ago   4. Is your CP continuous or coming and going? Coming and going   5. Have you taken Nitroglycerin? No  ?

## 2023-03-21 ENCOUNTER — Other Ambulatory Visit (HOSPITAL_COMMUNITY): Payer: Medicare Other

## 2023-03-21 ENCOUNTER — Encounter (HOSPITAL_COMMUNITY): Payer: Self-pay | Admitting: Cardiovascular Disease

## 2023-03-21 ENCOUNTER — Encounter (HOSPITAL_COMMUNITY)
Admission: EM | Disposition: A | Discharge status: Home / Self Care | Payer: Self-pay | Source: Home / Self Care | Attending: Student

## 2023-03-21 ENCOUNTER — Inpatient Hospital Stay (HOSPITAL_COMMUNITY)
Admit: 2023-03-21 | Discharge: 2023-03-21 | Disposition: A | Discharge status: Home / Self Care | Payer: Medicare Other | Attending: Internal Medicine | Admitting: Internal Medicine

## 2023-03-21 DIAGNOSIS — I441 Atrioventricular block, second degree: Secondary | ICD-10-CM | POA: Insufficient documentation

## 2023-03-21 DIAGNOSIS — R7989 Other specified abnormal findings of blood chemistry: Secondary | ICD-10-CM | POA: Diagnosis not present

## 2023-03-21 DIAGNOSIS — Z853 Personal history of malignant neoplasm of breast: Secondary | ICD-10-CM | POA: Diagnosis not present

## 2023-03-21 DIAGNOSIS — E782 Mixed hyperlipidemia: Secondary | ICD-10-CM | POA: Diagnosis not present

## 2023-03-21 DIAGNOSIS — Z79899 Other long term (current) drug therapy: Secondary | ICD-10-CM | POA: Diagnosis not present

## 2023-03-21 DIAGNOSIS — I10 Essential (primary) hypertension: Secondary | ICD-10-CM | POA: Insufficient documentation

## 2023-03-21 DIAGNOSIS — I200 Unstable angina: Secondary | ICD-10-CM

## 2023-03-21 DIAGNOSIS — R0789 Other chest pain: Secondary | ICD-10-CM | POA: Diagnosis not present

## 2023-03-21 DIAGNOSIS — R079 Chest pain, unspecified: Secondary | ICD-10-CM | POA: Diagnosis not present

## 2023-03-21 HISTORY — PX: LEFT HEART CATH AND CORONARY ANGIOGRAPHY: CATH118249

## 2023-03-21 LAB — LIPID PANEL
Cholesterol: 198 mg/dL (ref 0–200)
HDL: 116 mg/dL (ref >40)
LDL Cholesterol: 71 mg/dL (ref 0–99)
Total CHOL/HDL Ratio: 1.7 RATIO
Triglycerides: 54 mg/dL (ref <150)
VLDL: 11 mg/dL (ref 0–40)

## 2023-03-21 LAB — BASIC METABOLIC PANEL
Anion gap: 9 (ref 5–15)
BUN: 8 mg/dL (ref 8–23)
CO2: 24 mmol/L (ref 22–32)
Calcium: 8.8 mg/dL — ABNORMAL LOW (ref 8.9–10.3)
Chloride: 104 mmol/L (ref 98–111)
Creatinine, Ser: 0.6 mg/dL (ref 0.44–1.00)
GFR, Estimated: >60 mL/min (ref >60)
Glucose, Bld: 108 mg/dL — ABNORMAL HIGH (ref 70–99)
Potassium: 3.1 mmol/L — ABNORMAL LOW (ref 3.5–5.1)
Sodium: 137 mmol/L (ref 135–145)

## 2023-03-21 SURGERY — LEFT HEART CATH AND CORONARY ANGIOGRAPHY
Anesthesia: LOCAL

## 2023-03-21 MED ORDER — ORAL CARE MOUTH RINSE: 15.0000 mL | OROMUCOSAL | Status: DC | PRN

## 2023-03-21 MED ORDER — HEPARIN SODIUM (PORCINE) 1000 UNIT/ML IJ SOLN
INTRAMUSCULAR | Status: AC
  Filled 2023-03-21: qty 10

## 2023-03-21 MED ORDER — HEPARIN (PORCINE) IN NACL 1000-0.9 UT/500ML-% IV SOLN
INTRAVENOUS | Status: DC | PRN
  Administered 2023-03-21 (×2): 500 mL

## 2023-03-21 MED ORDER — SODIUM CHLORIDE 0.9% FLUSH: 3.0000 mL | INTRAVENOUS | Status: DC | PRN

## 2023-03-21 MED ORDER — SODIUM CHLORIDE 0.9 % IV SOLN: INTRAVENOUS | Status: DC

## 2023-03-21 MED ORDER — SODIUM CHLORIDE 0.9% FLUSH: 3.0000 mL | Freq: Two times a day (BID) | INTRAVENOUS | Status: DC

## 2023-03-21 MED ORDER — LIDOCAINE HCL (PF) 1 % IJ SOLN
INTRAMUSCULAR | Status: AC
  Filled 2023-03-21: qty 30

## 2023-03-21 MED ORDER — MIDAZOLAM HCL 2 MG/2ML IJ SOLN
INTRAMUSCULAR | Status: DC | PRN
  Administered 2023-03-21: 1 mg via INTRAVENOUS

## 2023-03-21 MED ORDER — VERAPAMIL HCL 2.5 MG/ML IV SOLN
INTRAVENOUS | Status: AC
  Filled 2023-03-21: qty 2

## 2023-03-21 MED ORDER — HEPARIN SODIUM (PORCINE) 1000 UNIT/ML IJ SOLN
INTRAMUSCULAR | Status: DC | PRN
  Administered 2023-03-21: 3000 [IU] via INTRAVENOUS

## 2023-03-21 MED ORDER — LABETALOL HCL 5 MG/ML IV SOLN: 10.0000 mg | INTRAVENOUS | Status: DC | PRN

## 2023-03-21 MED ORDER — FENTANYL CITRATE (PF) 100 MCG/2ML IJ SOLN
INTRAMUSCULAR | Status: DC | PRN
  Administered 2023-03-21: 25 ug via INTRAVENOUS

## 2023-03-21 MED ORDER — ONDANSETRON HCL 4 MG/2ML IJ SOLN: 4.0000 mg | Freq: Four times a day (QID) | INTRAMUSCULAR | Status: DC | PRN

## 2023-03-21 MED ORDER — IOHEXOL 350 MG/ML SOLN
INTRAVENOUS | Status: DC | PRN
  Administered 2023-03-21: 45 mL

## 2023-03-21 MED ORDER — MIDAZOLAM HCL 2 MG/2ML IJ SOLN
INTRAMUSCULAR | Status: AC
  Filled 2023-03-21: qty 2

## 2023-03-21 MED ORDER — VERAPAMIL HCL 2.5 MG/ML IV SOLN
INTRAVENOUS | Status: DC | PRN
  Administered 2023-03-21: 10 mL via INTRA_ARTERIAL

## 2023-03-21 MED ORDER — POTASSIUM CHLORIDE 10 MEQ/100ML IV SOLN
10.0000 meq | INTRAVENOUS | Status: AC
  Administered 2023-03-21 (×3): 10 meq via INTRAVENOUS
  Filled 2023-03-21 (×3): qty 100

## 2023-03-21 MED ORDER — FENTANYL CITRATE (PF) 100 MCG/2ML IJ SOLN
INTRAMUSCULAR | Status: AC
  Filled 2023-03-21: qty 2

## 2023-03-21 MED ORDER — HYDRALAZINE HCL 20 MG/ML IJ SOLN: 10.0000 mg | INTRAMUSCULAR | Status: DC | PRN

## 2023-03-21 MED ORDER — SODIUM CHLORIDE 0.9 % IV SOLN: 250.0000 mL | INTRAVENOUS | Status: DC | PRN

## 2023-03-21 MED ORDER — ACETAMINOPHEN 325 MG PO TABS: 650.0000 mg | ORAL_TABLET | ORAL | Status: DC | PRN

## 2023-03-21 MED ORDER — LIDOCAINE HCL (PF) 1 % IJ SOLN
INTRAMUSCULAR | Status: DC | PRN
  Administered 2023-03-21: 2 mL

## 2023-03-21 SURGICAL SUPPLY — 12 items
CATH INFINITI 5 FR JL3.5 (CATHETERS) IMPLANT
CATH INFINITI 5FR ANG PIGTAIL (CATHETERS) IMPLANT
CATH INFINITI AMBI 6FR TG (CATHETERS) IMPLANT
DEVICE RAD TR BAND REGULAR (VASCULAR PRODUCTS) IMPLANT
ELECT DEFIB PAD ADLT CADENCE (PAD) IMPLANT
GLIDESHEATH SLEND SS 6F .021 (SHEATH) IMPLANT
KIT HEART LEFT (KITS) ×1 IMPLANT
PACK CARDIAC CATHETERIZATION (CUSTOM PROCEDURE TRAY) ×1 IMPLANT
SHEATH PROBE COVER 6X72 (BAG) IMPLANT
TRANSDUCER W/STOPCOCK (MISCELLANEOUS) ×1 IMPLANT
TUBING CIL FLEX 10 FLL-RA (TUBING) ×1 IMPLANT
WIRE EMERALD 3MM-J .035X260CM (WIRE) IMPLANT

## 2023-03-21 NOTE — Progress Notes (Signed)
ZIO AT applied at hospital Dr. Acharya to read. 

## 2023-03-21 NOTE — Discharge Summary (Signed)
Discharge Summary    Patient ID: Kim Moreno MRN: [id number]; DOB: 1946/05/23  Admit date: 03/20/2023 Discharge date: 03/21/2023  PCP:  Merri Brunette, MD   [facility] Providers Cardiologist:  Parke Poisson, MD     Discharge Diagnoses    Principal Problem:   Chest pain Active Problems:   Elevated troponin   Mobitz II   Hypertension  Diagnostic Studies/Procedures    Cath: 03/21/2023  1.  No significant obstructive coronary artery disease of codominant system. 2.  LVEDP of 4 mmHg.   Recommendation: Medical therapy. _____________   History of Present Illness     Kim Moreno is a 77 y.o. female with past medical history of heart murmur, HTN, HLD, RBBB, coronary artery calcification seen on previous CT, and history of left breast cancer s/p radiation therapy in 2008.  Coronary calcium scoring obtained on 01/04/2021 demonstrated coronary calcium score of 231 which placed the patient on 67 percentile for age and sex matched control, heavily calcified mitral valve annulus.  She was last seen by Dr. Doreene Adas on 07/24/2022 for evaluation of abnormal ECG.  During the visit, was noted she has a 2 out of 6 systolic heart murmur as well.  A subsequent echocardiogram obtained on 08/22/2022 showed EF 60 to 65%, severe mitral annular calcification, mild MR, moderate calcification of the aortic valve with no evidence of aortic stenosis, moderate LAE, normal RV size and pressure.   Patient presented to [facility], ED via EMS on 03/20/2023 for evaluation of dizziness and chest pressure.  She states that she has been having some dizziness while working in front her computer before.  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  She has been noticing intermittent episode of fogginess with her mind recently.  She was working in front of her computer 2 days ago on 6/24 when she started noticing a substernal chest pressure.  It went away by itself  after a few minutes.  She denies any shortness of breath.  Her chest tightness was intermittent, lasting only a few minutes at a time.  Interestingly, she was able to walk to her back yard to turn on the sprinkler yesterday without chest pain.  She says she does not do much strenuous activity. She denies any exacerbating factors behind her chest pressure.  This morning she had recurrent chest pressure in the same spot.  She had a lunch date with her daughter near [facility].  After she got out of the car and was walking toward the restaurant she became very dizzy to the point where she decided to squat down to prevent herself from passing out.  She was able to eventually walk into the restaurant and have lunch with her daughter.  Dizziness went away by itself.  She called our office and spoke to our nurse who instructed her to go to the emergency room.   During EMS transport, it was noted patient was in 2:1 heart block.  While she was in the emergency room, she is mostly in sinus rhythm with heart rate 80s, however has brief episodes of second-degree type I and type II heart block along with 2-1 AV block.  She has not had any dizzy spells in the emergency room despite recurrence of 21 heart block.  She is currently asymptomatic.  Cardiology service consulted for abnormal telemetry.  Chest x-ray is normal.  Blood work is pending.    Hospital Course  Chest pain -- Known history of coronary artery calcification and elevated calcium score.  Presented with intermittent chest pain the day before admission.  High-sensitivity troponin 33>> 33.  Underwent cardiac catheterization noted above with no significant coronary artery disease noted.  Medical therapy recommended.  Intermittent second degree AV type I and type II -- Noted to be in 2:1 heart block during EMS transport.  Of note was not on any AV nodal blocking agent.  No further episodes noted during admission. -- plan for 2-week lab ZIO monitor --  Outpatient EP consult  HTN -- continue amlodipine 10mg  daily, olmesartan 20mg  daily   HLD -- continue Crestor 10mg  daily  Patient was seen by Dr. Excell Seltzer and deemed stable for discharge home. Follow up in the office arranged.   Did the patient have an acute coronary syndrome (MI, NSTEMI, STEMI, etc) this admission?:  No                               Did the patient have a percutaneous coronary intervention (stent / angioplasty)?:  No.    _____________  Discharge Vitals Blood pressure 131/68, pulse 66, temperature 97.7 F (36.5 C), temperature source Oral, resp. rate 19, height 5\' 2"  (1.575 m), weight 54.8 kg, SpO2 94 %.  Filed Weights   03/20/23 1545 03/20/23 2034  Weight: 54.9 kg 54.8 kg    Labs & Radiologic Studies    CBC Recent Labs    03/20/23 1657  WBC 7.8  NEUTROABS 5.6  HGB 14.1  HCT 41.8  MCV 100.2*  PLT 205   Basic Metabolic Panel Recent Labs    40/98/11 1657 03/21/23 0026  NA 137 137  K 3.5 3.1*  CL 101 104  CO2 23 24  GLUCOSE 131* 108*  BUN 17 8  CREATININE 0.75 0.60  CALCIUM 9.7 8.8*   Liver Function Tests Recent Labs    03/20/23 1657  AST 22  ALT 16  ALKPHOS 59  BILITOT 0.6  PROT 6.4*  ALBUMIN 3.5   No results for input(s): "LIPASE", "AMYLASE" in the last 72 hours. High Sensitivity Troponin:   Recent Labs  Lab 03/20/23 1657 03/20/23 1820  TROPONINIHS 33* 33*    BNP Invalid input(s): "POCBNP" D-Dimer No results for input(s): "DDIMER" in the last 72 hours. Hemoglobin A1C No results for input(s): "HGBA1C" in the last 72 hours. Fasting Lipid Panel Recent Labs    03/21/23 0026  CHOL 198  HDL 116  LDLCALC 71  TRIG 54  CHOLHDL 1.7   Thyroid Function Tests Recent Labs    03/20/23 1820  TSH 3.043   _____________  CARDIAC CATHETERIZATION  Result Date: 03/21/2023 1.  No significant obstructive coronary artery disease of codominant system. 2.  LVEDP of 4 mmHg. Recommendation: Medical therapy.   DG Chest 2 View  Result  Date: 03/20/2023 CLINICAL DATA:  Chest pain. EXAM: CHEST - 2 VIEW COMPARISON:  None Available. FINDINGS: The heart size and mediastinal contours are within normal limits. Both lungs are clear. The visualized skeletal structures are unremarkable. IMPRESSION: No active cardiopulmonary disease. Electronically Signed   By: Lupita Raider M.D.   On: 03/20/2023 16:24   Disposition   Pt is being discharged home today in good condition.  Follow-up Plans & Appointments     Follow-up Information     Lanier Prude, MD Follow up on 04/29/2023.   Specialties: Cardiology, Radiology Why: at 10am for your follow up  appt with the electrophyisologist Contact information: [address] [place] [address] [phone]         Azalee Course, [place] Follow up on 04/04/2023.   Specialties: Cardiology, Radiology Why: at 8:25am for your follow up appt Contact information: [address] [place] [address] [phone]                Discharge Instructions     Call MD for:  difficulty breathing, headache or visual disturbances   Complete by: As directed    Call MD for:  persistant dizziness or light-headedness   Complete by: As directed    Call MD for:  redness, tenderness, or signs of infection (pain, swelling, redness, odor or green/yellow discharge around incision site)   Complete by: As directed    Diet - low sodium heart healthy   Complete by: As directed    Discharge instructions   Complete by: As directed    Radial Site Care Refer to this sheet in the next few weeks. These instructions provide you with information on caring for yourself after your procedure. Your caregiver may also give you more specific instructions. Your treatment has been planned according to current medical practices, but problems sometimes occur. Call your caregiver if you have any problems or questions after your procedure. HOME CARE INSTRUCTIONS You may shower the day after the  procedure. Remove the bandage (dressing) and gently wash the site with plain soap and water. Gently pat the site dry.  Do not apply powder or lotion to the site.  Do not submerge the affected site in water for 3 to 5 days.  Inspect the site at least twice daily.  Do not flex or bend the affected arm for 24 hours.  No lifting over 5 pounds (2.3 kg) for 5 days after your procedure.  Do not drive home if you are discharged the same day of the procedure. Have someone else drive you.  You may drive 24 hours after the procedure unless otherwise instructed by your caregiver.  What to expect: Any bruising will usually fade within 1 to 2 weeks.  Blood that collects in the tissue (hematoma) may be painful to the touch. It should usually decrease in size and tenderness within 1 to 2 weeks.  SEEK IMMEDIATE MEDICAL CARE IF: You have unusual pain at the radial site.  You have redness, warmth, swelling, or pain at the radial site.  You have drainage (other than a small amount of blood on the dressing).  You have chills.  You have a fever or persistent symptoms for more than 72 hours.  You have a fever and your symptoms suddenly get worse.  Your arm becomes pale, cool, tingly, or numb.  You have heavy bleeding from the site. Hold pressure on the site.   Increase activity slowly   Complete by: As directed         Discharge Medications   Allergies as of 03/21/2023       Reactions   Arimidex [anastrozole] Rash   Latex Rash        Medication List     TAKE these medications    Alpha-Lipoic Acid 100 MG Tabs Take 100 mg by mouth daily. Pt takes 1/2 tab   amLODipine 10 MG tablet Commonly known as: NORVASC Take 1 tablet (10 mg total) by mouth daily.   aspirin 81 MG chewable tablet Chew 81 mg by mouth daily.   Co-Enzyme Q-10 100 MG Caps Take  100 mg by mouth daily. Pt takes 1/2 tab   COPPER CAPS PO Take 5 mg by mouth daily. Pt takes 1/2 tab   DHA PO Take 300 mg by mouth daily. Pt  takes 1/2 tab   EPA PO Take by mouth.   ergocalciferol (VITAMIN D2) 200 MCG/ML drops Commonly known as: DRISDOL Take 4,000 Units by mouth daily.   GREEN FOOD COMPLEX PO Take 2 capsules by mouth daily.   IRON-VITAMINS PO Take 6 mg by mouth.   LUTEIN PO Take 3 mg by mouth daily. Pt takes 1/2 tab   LYCOPENE PO Take 1 mg by mouth daily. Pt takes 1/2 tab   MAGNESIUM PO Take by mouth. Pt takes 1/2 tablet   NIACIN PO Take 20 mg by mouth daily. Pt takes 1/2 tab   olmesartan 20 MG tablet Commonly known as: BENICAR Take 2 tablets (40 mg total) by mouth daily.   omega-3 acid ethyl esters 1 g capsule Commonly known as: LOVAZA Take 2 g by mouth daily.   PANTOTHENIC ACID PO Take 10 mg by mouth. Pt take 1/2 tab   PROBIOTIC PO Take by mouth daily.   Quercetin 50 MG Tabs Take 50 mg by mouth daily. Pt takes 1/2 tab   RESVERATROL PO Take 50 mg by mouth daily. Pt takes 1/2 tab   rosuvastatin 10 MG tablet Commonly known as: CRESTOR Take 1 tablet (10 mg total) by mouth daily.   SELENIUM PO Take 70 mcg by mouth daily. Pt takes 1/2 tab   SILYMARIN PO Take 100 mg by mouth daily. Pt takes 1/2 tab   THIAMINE PO Take 3 mg by mouth daily. Pt takes 1/2 tab   vitamin A 40981 UNIT capsule Take 7,000 Units by mouth daily.   ZINC PO Take 15 mg by mouth daily. Pt takes 1/2 tab         Outstanding Labs/Studies   Live Zio placed at discharge   Duration of Discharge Encounter   Greater than 30 minutes including physician time.  Signed, Laverda Page, NP 03/21/2023, 1:49 PM

## 2023-03-21 NOTE — H&P (View-Only) (Signed)
 Rounding Note    Patient Name: Kim Moreno Date of Encounter: 03/21/2023  [facility] Cardiologist: Gayatri A Acharya, MD   Subjective   No chest pain or shortness of breath overnight.  No complaints overnight.  Inpatient Medications    Scheduled Meds:  amLODipine  10 mg Oral Daily   aspirin EC  81 mg Oral Daily   heparin  5,000 Units Subcutaneous Q8H   irbesartan  300 mg Oral Daily   rosuvastatin  10 mg Oral Daily   sodium chloride flush  3 mL Intravenous Q12H   Continuous Infusions:  sodium chloride     sodium chloride 1 mL/kg/hr (03/21/23 0659)   potassium chloride 10 mEq (03/21/23 0659)   PRN Meds: sodium chloride, acetaminophen, nitroGLYCERIN, ondansetron (ZOFRAN) IV, mouth rinse, sodium chloride flush   Vital Signs    Vitals:   03/20/23 1935 03/20/23 2034 03/20/23 2348 03/21/23 0326  BP: 123/61 (!) 146/74 115/65 113/61  Pulse: 70 71 63 66  Resp: 14 14 15 20  Temp: 98.2 F (36.8 C) 97.8 F (36.6 C) 98.5 F (36.9 C) 97.9 F (36.6 C)  TempSrc: Oral Oral Oral Oral  SpO2: 94%  94% 97%  Weight:  54.8 kg    Height:  5' 2" (1.575 m)      Intake/Output Summary (Last 24 hours) at 03/21/2023 0757 Last data filed at 03/20/2023 2124 Gross per 24 hour  Intake 240 ml  Output --  Net 240 ml      03/20/2023    8:34 PM 03/20/2023    3:45 PM 07/24/2022    8:34 AM  Last 3 Weights  Weight (lbs) 120 lb 13 oz 121 lb 125 lb 3.2 oz  Weight (kg) 54.8 kg 54.885 kg 56.79 kg      Telemetry    Sinus rhythm, no AV block or bradycardic events - Personally Reviewed  ECG    Sinus arrhythmia 59 bpm, right bundle branch block - Personally Reviewed  Physical Exam  Alert, oriented, no distress GEN: No acute distress.   Neck: No JVD Cardiac: RRR, no murmurs, rubs, or gallops.  Respiratory: Clear to auscultation bilaterally. GI: Soft, nontender, non-distended  MS: No edema; No deformity. Neuro:  Nonfocal  Psych: Normal affect   Labs    High Sensitivity  Troponin:   Recent Labs  Lab 03/20/23 1657 03/20/23 1820  TROPONINIHS 33* 33*     Chemistry Recent Labs  Lab 03/20/23 1657 03/21/23 0026  NA 137 137  K 3.5 3.1*  CL 101 104  CO2 23 24  GLUCOSE 131* 108*  BUN 17 8  CREATININE 0.75 0.60  CALCIUM 9.7 8.8*  PROT 6.4*  --   ALBUMIN 3.5  --   AST 22  --   ALT 16  --   ALKPHOS 59  --   BILITOT 0.6  --   GFRNONAA >60 >60  ANIONGAP 13 9    Lipids  Recent Labs  Lab 03/21/23 0026  CHOL 198  TRIG 54  HDL 116  LDLCALC 71  CHOLHDL 1.7    Hematology Recent Labs  Lab 03/20/23 1657  WBC 7.8  RBC 4.17  HGB 14.1  HCT 41.8  MCV 100.2*  MCH 33.8  MCHC 33.7  RDW 13.0  PLT 205   Thyroid  Recent Labs  Lab 03/20/23 1820  TSH 3.043    BNPNo results for input(s): "BNP", "PROBNP" in the last 168 hours.  DDimer No results for input(s): "DDIMER" in the last   168 hours.   Radiology    DG Chest 2 View  Result Date: 03/20/2023 CLINICAL DATA:  Chest pain. EXAM: CHEST - 2 VIEW COMPARISON:  None Available. FINDINGS: The heart size and mediastinal contours are within normal limits. Both lungs are clear. The visualized skeletal structures are unremarkable. IMPRESSION: No active cardiopulmonary disease. Electronically Signed   By: James  Green Jr M.D.   On: 03/20/2023 16:24    Cardiac Studies   Pending  Patient Profile     77 y.o. female with chest pain and mildly elevated troponin as well as near syncope with Mobitz type I AV block  Assessment & Plan    1.  Chest pain at rest, mildly elevated high-sensitivity troponin: In the setting of new onset chest discomfort and AV block with near syncope, favor definitive evaluation with cardiac catheterization to evaluate for obstructive CAD potentially causing AV block and conduction disease.  Patient with an elevated coronary calcium score noted in the past.  Risks, indications, and alternatives to cardiac catheterization have been reviewed with the patient, see admission H&P  yesterday.  Strong right radial pulse noted.  Further plans pending her cardiac catheterization result. 2.  Intermittent second-degree type I AV block with brief episodes of 2-1 AV block.  Will discuss with EP, consider formal EP consultation versus placement of a ZIO monitor at discharge.  Await cardiac catheterization result.  Likely will place a 2-week live ZIO monitor and outpatient EP consultation after the monitor is completed. 3.  Hypertension: Treated with amlodipine and olmesartan, blood pressure controlled, avoid AV nodal blockade. 4.  Mixed hyperlipidemia: Treated with rosuvastatin 10 mg daily, LDL cholesterol 71.  For questions or updates, please contact [facility] Please consult [email or web address] for contact info under        Signed, Mike Hamre, MD  03/21/2023, 7:57 AM    

## 2023-03-21 NOTE — Progress Notes (Signed)
Rounding Note    Patient Name: Kim Moreno Date of Encounter: 03/21/2023  [facility] Cardiologist: Parke Poisson, MD   Subjective   No chest pain or shortness of breath overnight.  No complaints overnight.  Inpatient Medications    Scheduled Meds:  amLODipine  10 mg Oral Daily   aspirin EC  81 mg Oral Daily   heparin  5,000 Units Subcutaneous Q8H   irbesartan  300 mg Oral Daily   rosuvastatin  10 mg Oral Daily   sodium chloride flush  3 mL Intravenous Q12H   Continuous Infusions:  sodium chloride     sodium chloride 1 mL/kg/hr (03/21/23 0659)   potassium chloride 10 mEq (03/21/23 0659)   PRN Meds: sodium chloride, acetaminophen, nitroGLYCERIN, ondansetron (ZOFRAN) IV, mouth rinse, sodium chloride flush   Vital Signs    Vitals:   03/20/23 1935 03/20/23 2034 03/20/23 2348 03/21/23 0326  BP: 123/61 (!) 146/74 115/65 113/61  Pulse: 70 71 63 66  Resp: 14 14 15 20   Temp: 98.2 F (36.8 C) 97.8 F (36.6 C) 98.5 F (36.9 C) 97.9 F (36.6 C)  TempSrc: Oral Oral Oral Oral  SpO2: 94%  94% 97%  Weight:  54.8 kg    Height:  5\' 2"  (1.575 m)      Intake/Output Summary (Last 24 hours) at 03/21/2023 0757 Last data filed at 03/20/2023 2124 Gross per 24 hour  Intake 240 ml  Output --  Net 240 ml      03/20/2023    8:34 PM 03/20/2023    3:45 PM 07/24/2022    8:34 AM  Last 3 Weights  Weight (lbs) 120 lb 13 oz 121 lb 125 lb 3.2 oz  Weight (kg) 54.8 kg 54.885 kg 56.79 kg      Telemetry    Sinus rhythm, no AV block or bradycardic events - Personally Reviewed  ECG    Sinus arrhythmia 59 bpm, right bundle branch block - Personally Reviewed  Physical Exam  Alert, oriented, no distress GEN: No acute distress.   Neck: No JVD Cardiac: RRR, no murmurs, rubs, or gallops.  Respiratory: Clear to auscultation bilaterally. GI: Soft, nontender, non-distended  MS: No edema; No deformity. Neuro:  Nonfocal  Psych: Normal affect   Labs    High Sensitivity  Troponin:   Recent Labs  Lab 03/20/23 1657 03/20/23 1820  TROPONINIHS 33* 33*     Chemistry Recent Labs  Lab 03/20/23 1657 03/21/23 0026  NA 137 137  K 3.5 3.1*  CL 101 104  CO2 23 24  GLUCOSE 131* 108*  BUN 17 8  CREATININE 0.75 0.60  CALCIUM 9.7 8.8*  PROT 6.4*  --   ALBUMIN 3.5  --   AST 22  --   ALT 16  --   ALKPHOS 59  --   BILITOT 0.6  --   GFRNONAA >60 >60  ANIONGAP 13 9    Lipids  Recent Labs  Lab 03/21/23 0026  CHOL 198  TRIG 54  HDL 116  LDLCALC 71  CHOLHDL 1.7    Hematology Recent Labs  Lab 03/20/23 1657  WBC 7.8  RBC 4.17  HGB 14.1  HCT 41.8  MCV 100.2*  MCH 33.8  MCHC 33.7  RDW 13.0  PLT 205   Thyroid  Recent Labs  Lab 03/20/23 1820  TSH 3.043    BNPNo results for input(s): "BNP", "PROBNP" in the last 168 hours.  DDimer No results for input(s): "DDIMER" in the last  168 hours.   Radiology    DG Chest 2 View  Result Date: 03/20/2023 CLINICAL DATA:  Chest pain. EXAM: CHEST - 2 VIEW COMPARISON:  None Available. FINDINGS: The heart size and mediastinal contours are within normal limits. Both lungs are clear. The visualized skeletal structures are unremarkable. IMPRESSION: No active cardiopulmonary disease. Electronically Signed   By: Lupita Raider M.D.   On: 03/20/2023 16:24    Cardiac Studies   Pending  Patient Profile     77 y.o. female with chest pain and mildly elevated troponin as well as near syncope with Mobitz type I AV block  Assessment & Plan    1.  Chest pain at rest, mildly elevated high-sensitivity troponin: In the setting of new onset chest discomfort and AV block with near syncope, favor definitive evaluation with cardiac catheterization to evaluate for obstructive CAD potentially causing AV block and conduction disease.  Patient with an elevated coronary calcium score noted in the past.  Risks, indications, and alternatives to cardiac catheterization have been reviewed with the patient, see admission H&P  yesterday.  Strong right radial pulse noted.  Further plans pending her cardiac catheterization result. 2.  Intermittent second-degree type I AV block with brief episodes of 2-1 AV block.  Will discuss with EP, consider formal EP consultation versus placement of a ZIO monitor at discharge.  Await cardiac catheterization result.  Likely will place a 2-week live ZIO monitor and outpatient EP consultation after the monitor is completed. 3.  Hypertension: Treated with amlodipine and olmesartan, blood pressure controlled, avoid AV nodal blockade. 4.  Mixed hyperlipidemia: Treated with rosuvastatin 10 mg daily, LDL cholesterol 71.  For questions or updates, please contact [facility] Please consult [email or web address] for contact info under        Signed, Tonny Bollman, MD  03/21/2023, 7:57 AM

## 2023-03-21 NOTE — Interval H&P Note (Signed)
History and Physical Interval Note:  03/21/2023 12:00 PM  Kim Moreno  has presented today for surgery, with the diagnosis of unstable angina.  The various methods of treatment have been discussed with the patient and family. After consideration of risks, benefits and other options for treatment, the patient has consented to  Procedure(s): LEFT HEART CATH AND CORONARY ANGIOGRAPHY (N/A) as a surgical intervention.  The patient's history has been reviewed, patient examined, no change in status, stable for surgery.  I have reviewed the patient's chart and labs.  Questions were answered to the patient's satisfaction.     Orbie Pyo

## 2023-03-22 ENCOUNTER — Telehealth: Payer: Self-pay | Admitting: Internal Medicine

## 2023-03-22 ENCOUNTER — Telehealth: Payer: Self-pay

## 2023-03-22 ENCOUNTER — Encounter (HOSPITAL_COMMUNITY): Payer: Self-pay | Admitting: Internal Medicine

## 2023-03-22 DIAGNOSIS — I441 Atrioventricular block, second degree: Secondary | ICD-10-CM | POA: Diagnosis not present

## 2023-03-22 LAB — LIPOPROTEIN A (LPA): Lipoprotein (a): 313.6 nmol/L — ABNORMAL HIGH (ref <75.0)

## 2023-03-22 NOTE — Telephone Encounter (Signed)
Kim Moreno from [facility] called last night at 9:40pm to report critical EKG  Reference number: [id number]

## 2023-03-22 NOTE — Telephone Encounter (Signed)
Patient is not certain if she took her amlodipine 10 mg, olmesartan 40 mg, , and rosuvastatin 10 mg, twice this morning.  She is not certain but wanted to let us know just incase she did. Current blood pressure 131/71 hr 93. She feels fine. Advised to monitor blood pressure and heart rate. Give [place] a call if blood pressure drops below 110/60 and heart rate drops below 60. Discussed ED precautions.

## 2023-03-22 NOTE — Telephone Encounter (Signed)
Pt c/o medication issue:  1. Name of Medication: amLODipine (NORVASC) 10 MG tablet   olmesartan (BENICAR) 20 MG tablet   rosuvastatin (CRESTOR) 10 MG tablet   2. How are you currently taking this medication (dosage and times per day)?   3. Are you having a reaction (difficulty breathing--STAT)? no  4. What is your medication issue? Patient calling in because she may have taken the medications twice instead of once. Calling to see if she will be okay. Please advise  BP:137/67

## 2023-03-24 ENCOUNTER — Telehealth: Payer: Self-pay | Admitting: Internal Medicine

## 2023-03-24 NOTE — Telephone Encounter (Signed)
[employer] contacted the after hours cardiology line to discuss an abnormal rhythm for Kim Moreno.  Patient apparently had back to back with possible high-grade AV block, with one episode lasting 7.6 seconds but multiple intervals.  [employer] did try contacting patient but was unable to reach initially but finally reached patient and she was asymptomatic. I will attempt to contact patient as well.

## 2023-03-25 ENCOUNTER — Other Ambulatory Visit: Payer: Self-pay

## 2023-03-25 ENCOUNTER — Telehealth: Payer: Self-pay | Admitting: Internal Medicine

## 2023-03-25 ENCOUNTER — Ambulatory Visit (HOSPITAL_COMMUNITY)
Admission: EM | Disposition: A | Discharge status: Home / Self Care | Payer: Self-pay | Source: Home / Self Care | Attending: Emergency Medicine

## 2023-03-25 ENCOUNTER — Telehealth: Payer: Self-pay | Admitting: Physician Assistant

## 2023-03-25 ENCOUNTER — Observation Stay (HOSPITAL_COMMUNITY)
Admission: EM | Admit: 2023-03-25 | Discharge: 2023-03-26 | Disposition: A | Discharge status: Home / Self Care | Payer: Medicare Other | Attending: Internal Medicine | Admitting: Internal Medicine

## 2023-03-25 ENCOUNTER — Emergency Department (HOSPITAL_COMMUNITY): Payer: Medicare Other

## 2023-03-25 ENCOUNTER — Observation Stay (HOSPITAL_COMMUNITY): Payer: Medicare Other

## 2023-03-25 ENCOUNTER — Encounter (HOSPITAL_COMMUNITY): Payer: Self-pay

## 2023-03-25 DIAGNOSIS — R001 Bradycardia, unspecified: Secondary | ICD-10-CM | POA: Diagnosis present

## 2023-03-25 DIAGNOSIS — R0789 Other chest pain: Secondary | ICD-10-CM | POA: Diagnosis not present

## 2023-03-25 DIAGNOSIS — Z950 Presence of cardiac pacemaker: Secondary | ICD-10-CM | POA: Diagnosis not present

## 2023-03-25 DIAGNOSIS — I441 Atrioventricular block, second degree: Secondary | ICD-10-CM | POA: Diagnosis not present

## 2023-03-25 DIAGNOSIS — I442 Atrioventricular block, complete: Secondary | ICD-10-CM | POA: Diagnosis present

## 2023-03-25 DIAGNOSIS — I4510 Unspecified right bundle-branch block: Secondary | ICD-10-CM | POA: Insufficient documentation

## 2023-03-25 DIAGNOSIS — I10 Essential (primary) hypertension: Secondary | ICD-10-CM | POA: Insufficient documentation

## 2023-03-25 DIAGNOSIS — Z91040 Latex allergy status: Secondary | ICD-10-CM | POA: Diagnosis not present

## 2023-03-25 DIAGNOSIS — I517 Cardiomegaly: Secondary | ICD-10-CM | POA: Diagnosis not present

## 2023-03-25 DIAGNOSIS — Z853 Personal history of malignant neoplasm of breast: Secondary | ICD-10-CM | POA: Diagnosis not present

## 2023-03-25 DIAGNOSIS — Z7982 Long term (current) use of aspirin: Secondary | ICD-10-CM | POA: Insufficient documentation

## 2023-03-25 DIAGNOSIS — Z79899 Other long term (current) drug therapy: Secondary | ICD-10-CM | POA: Diagnosis not present

## 2023-03-25 DIAGNOSIS — J90 Pleural effusion, not elsewhere classified: Secondary | ICD-10-CM | POA: Diagnosis not present

## 2023-03-25 HISTORY — PX: PACEMAKER IMPLANT: EP1218

## 2023-03-25 LAB — TSH: TSH: 2.715 u[IU]/mL (ref 0.350–4.500)

## 2023-03-25 LAB — BASIC METABOLIC PANEL
Anion gap: 16 — ABNORMAL HIGH (ref 5–15)
BUN: 26 mg/dL — ABNORMAL HIGH (ref 8–23)
CO2: 23 mmol/L (ref 22–32)
Calcium: 9.9 mg/dL (ref 8.9–10.3)
Chloride: 101 mmol/L (ref 98–111)
Creatinine, Ser: 0.87 mg/dL (ref 0.44–1.00)
GFR, Estimated: >60 mL/min (ref >60)
Glucose, Bld: 138 mg/dL — ABNORMAL HIGH (ref 70–99)
Potassium: 4 mmol/L (ref 3.5–5.1)
Sodium: 140 mmol/L (ref 135–145)

## 2023-03-25 LAB — MAGNESIUM: Magnesium: 1.9 mg/dL (ref 1.7–2.4)

## 2023-03-25 LAB — CBC
HCT: 42 % (ref 36.0–46.0)
Hemoglobin: 13.7 g/dL (ref 12.0–15.0)
MCH: 32.7 pg (ref 26.0–34.0)
MCHC: 32.6 g/dL (ref 30.0–36.0)
MCV: 100.2 fL — ABNORMAL HIGH (ref 80.0–100.0)
Platelets: 232 10*3/uL (ref 150–400)
RBC: 4.19 MIL/uL (ref 3.87–5.11)
RDW: 13.1 % (ref 11.5–15.5)
WBC: 9.3 10*3/uL (ref 4.0–10.5)
nRBC: 0 % (ref 0.0–0.2)

## 2023-03-25 LAB — TROPONIN I (HIGH SENSITIVITY): Troponin I (High Sensitivity): 11 ng/L (ref <18)

## 2023-03-25 SURGERY — PACEMAKER IMPLANT

## 2023-03-25 MED ORDER — FENTANYL CITRATE (PF) 100 MCG/2ML IJ SOLN
INTRAMUSCULAR | Status: AC
  Filled 2023-03-25: qty 2

## 2023-03-25 MED ORDER — CEFAZOLIN SODIUM-DEXTROSE 2-4 GM/100ML-% IV SOLN
2.0000 g | INTRAVENOUS | Status: AC
  Administered 2023-03-25: 2 g via INTRAVENOUS
  Filled 2023-03-25: qty 100

## 2023-03-25 MED ORDER — CHLORHEXIDINE GLUCONATE 4 % EX SOLN: 60.0000 mL | Freq: Once | CUTANEOUS | Status: DC

## 2023-03-25 MED ORDER — ONDANSETRON HCL 4 MG/2ML IJ SOLN: 4.0000 mg | Freq: Four times a day (QID) | INTRAMUSCULAR | Status: DC | PRN

## 2023-03-25 MED ORDER — CEFAZOLIN SODIUM-DEXTROSE 2-4 GM/100ML-% IV SOLN
INTRAVENOUS | Status: AC
  Filled 2023-03-25: qty 100

## 2023-03-25 MED ORDER — ASPIRIN 81 MG PO TBEC
81.0000 mg | DELAYED_RELEASE_TABLET | Freq: Every day | ORAL | Status: DC
  Administered 2023-03-26: 81 mg via ORAL
  Filled 2023-03-25: qty 1

## 2023-03-25 MED ORDER — LIDOCAINE HCL (PF) 1 % IJ SOLN
INTRAMUSCULAR | Status: DC | PRN
  Administered 2023-03-25: 60 mL

## 2023-03-25 MED ORDER — SODIUM CHLORIDE 0.9% FLUSH
3.0000 mL | Freq: Two times a day (BID) | INTRAVENOUS | Status: DC
  Administered 2023-03-25 (×2): 3 mL via INTRAVENOUS

## 2023-03-25 MED ORDER — CEFAZOLIN SODIUM-DEXTROSE 1-4 GM/50ML-% IV SOLN
1.0000 g | Freq: Four times a day (QID) | INTRAVENOUS | Status: AC
  Administered 2023-03-25 – 2023-03-26 (×3): 1 g via INTRAVENOUS
  Filled 2023-03-25 (×3): qty 50

## 2023-03-25 MED ORDER — ACETAMINOPHEN 325 MG PO TABS
650.0000 mg | ORAL_TABLET | ORAL | Status: DC | PRN
  Administered 2023-03-26 (×2): 650 mg via ORAL
  Filled 2023-03-25 (×2): qty 2

## 2023-03-25 MED ORDER — SODIUM CHLORIDE 0.9 % IV SOLN: INTRAVENOUS | Status: DC

## 2023-03-25 MED ORDER — MIDAZOLAM HCL 5 MG/5ML IJ SOLN
INTRAMUSCULAR | Status: DC | PRN
  Administered 2023-03-25 (×2): 1 mg via INTRAVENOUS

## 2023-03-25 MED ORDER — MIDAZOLAM HCL 5 MG/5ML IJ SOLN
INTRAMUSCULAR | Status: AC
  Filled 2023-03-25: qty 5

## 2023-03-25 MED ORDER — HEPARIN (PORCINE) IN NACL 1000-0.9 UT/500ML-% IV SOLN
INTRAVENOUS | Status: DC | PRN
  Administered 2023-03-25: 500 mL

## 2023-03-25 MED ORDER — SODIUM CHLORIDE 0.9 % IV SOLN
80.0000 mg | INTRAVENOUS | Status: AC
  Administered 2023-03-25: 80 mg

## 2023-03-25 MED ORDER — SODIUM CHLORIDE 0.9 % IV SOLN
INTRAVENOUS | Status: AC
  Filled 2023-03-25: qty 2

## 2023-03-25 MED ORDER — FENTANYL CITRATE (PF) 100 MCG/2ML IJ SOLN
INTRAMUSCULAR | Status: DC | PRN
  Administered 2023-03-25 (×2): 12.5 ug via INTRAVENOUS

## 2023-03-25 MED ORDER — SODIUM CHLORIDE 0.9 % IV SOLN: 250.0000 mL | INTRAVENOUS | Status: DC

## 2023-03-25 MED ORDER — LIDOCAINE HCL (PF) 1 % IJ SOLN
INTRAMUSCULAR | Status: AC
  Filled 2023-03-25: qty 60

## 2023-03-25 MED ORDER — ACETAMINOPHEN 325 MG PO TABS: 325.0000 mg | ORAL_TABLET | ORAL | Status: DC | PRN

## 2023-03-25 SURGICAL SUPPLY — 14 items
CABLE SURGICAL S-101-97-12 (CABLE) ×1 IMPLANT
CATH CPS LOCATOR 3D MED (CATHETERS) IMPLANT
HELIX LOCKING TOOL (MISCELLANEOUS) ×1
LEAD ULTIPACE 52 LPA1231/52 (Lead) IMPLANT
LEAD ULTIPACE 65 LPA1231/65 (Lead) IMPLANT
PACEMAKER ASSURITY DR-RF (Pacemaker) IMPLANT
PAD DEFIB RADIO PHYSIO CONN (PAD) ×1 IMPLANT
SET INTRODUCER MICROPUNCT 5F (INTRODUCER) IMPLANT
SHEATH 7FR PRELUDE SNAP 13 (SHEATH) IMPLANT
SHEATH 9FR PRELUDE SNAP 13 (SHEATH) IMPLANT
SLITTER AGILIS HISPRO (INSTRUMENTS) IMPLANT
TOOL HELIX LOCKING (MISCELLANEOUS) IMPLANT
TRAY PACEMAKER INSERTION (PACKS) ×1 IMPLANT
WIRE HI TORQ VERSACORE-J 145CM (WIRE) IMPLANT

## 2023-03-25 NOTE — Telephone Encounter (Signed)
[facility] ([place]) called regarding patient and  low HR.  States they spoke with patient at 7:55 am regarding HR 34 with total of 5 episodes total time of 5 Minutes 58 seconds.  States her average HR is at 33 bpm. At the time of call patient was with her brother waiting on our office to call with instructions.  Since that time, the PA Azalee Course has advised patient to go to ER.    Called patient at 8:15 and she has just arrived at the ED.

## 2023-03-25 NOTE — H&P (Addendum)
Cardiology Admission History and Physical   Patient ID: AOI GODETTE MRN: [id number]; DOB: November 29, 1945   Admission date: 03/25/2023  PCP:  Merri Brunette, MD   [facility] Cardiologist:  Parke Poisson, MD    Chief Complaint:  recurrent near syncope, bradycardia  Patient Profile:   [name] is a 77 y.o. female with HTN, HLD, RBBB, L breast cancer (s/p XRT 2008) who is being seen 03/25/2023 for the evaluation of advanced heart block.  History of Present Illness:   Kim Moreno was recently admitted 03/20/23 with c/o dizziness for a couple days, feeling foggy, intermittently, the day of admission she was walking onto a restaurant when she became very dizzy, and had to squat down until feeling better, did not have syncope. She also reported some intermittent chest pain as well. She had her lunch went home and decided to call the office and advised to go to go to the ER Apparently calling EMS and was reported to have been in 2:1 AVblock, also noted in the ER without symptoms.  She was not on a nodal blocking agent and planned for LHC. She had no significant CAD, no further AV block was noted and discharged 03/21/23 with Zio monitor  Overnight/early this AM [employer] notified of recurrent bradycardia/pauses with reports of: 11.1-second asystole occurring on 6/30 at 6:29 PM.  Apparently she had 3 to 5-second pauses during sleep overnight as well.  Around 5:02 AM this morning she had another 2 episode of prolonged pauses, first episode lasted 11 seconds, this was immediately followed by another 3-second pause. Per [employer] report, they contacted the patient around 5:08 AM this morning, patient says she woke up around 5:02 AM feeling her heart was slow.   She was advised to come to the ER  LABS K+ 4.0 BUN/Creat 26/0.87 Mag 1.9 HS Trop 11 WBC 9/3 H/H 13/42 Plts 232  03/20/23: TSH 3.043  Home meds No nodal blocking agents noted  She feels well currently, though  this AM when she woke she felt her heart slow and felt weak, she knew something was off.   On the way here she had 2 near fainting episodes again. She does not recall yesterday 6:30 specifically but would have been awake at that time, She has not had syncope.   Past Medical History:  Diagnosis Date   Breast cancer [facility]) 2007   Personal history of radiation therapy 2008   left breast    Past Surgical History:  Procedure Laterality Date   BREAST LUMPECTOMY Left 2007   LEFT HEART CATH AND CORONARY ANGIOGRAPHY N/A 03/21/2023   Procedure: LEFT HEART CATH AND CORONARY ANGIOGRAPHY;  Surgeon: Orbie Pyo, MD;  Location: [facility] INVASIVE CV LAB;  Service: Cardiovascular;  Laterality: N/A;     Medications Prior to Admission: Prior to Admission medications   Medication Sig Start Date End Date Taking? Authorizing Provider  Alpha-Lipoic Acid 100 MG TABS Take 100 mg by mouth daily. Pt takes 1/2 tab    [provider]  amLODipine (NORVASC) 10 MG tablet Take 1 tablet (10 mg total) by mouth daily. 07/24/22   Parke Poisson, MD  aspirin 81 MG chewable tablet Chew 81 mg by mouth daily.    [provider]  Beta Carotene (VITAMIN A) 25000 UNIT capsule Take 7,000 Units by mouth daily.    [provider]  Co-Enzyme Q-10 100 MG CAPS Take 100 mg by mouth daily. Pt takes 1/2 tab  [provider]  Copper Gluconate (COPPER CAPS PO) Take 5 mg by mouth daily. Pt takes 1/2 tab    [provider]  Docosahexaenoic Acid (DHA PO) Take 300 mg by mouth daily. Pt takes 1/2 tab    [provider]  ergocalciferol (DRISDOL) 200 MCG/ML drops Take 4,000 Units by mouth daily.    [provider]  IRON-VITAMINS PO Take 6 mg by mouth.    [provider]  LUTEIN PO Take 3 mg by mouth daily. Pt takes 1/2 tab    [provider]  LYCOPENE PO Take 1 mg by mouth daily. Pt takes 1/2 tab    [provider]  MAGNESIUM PO Take by mouth. Pt takes  1/2 tablet    [provider]  Milk Thistle-Turmeric (SILYMARIN PO) Take 100 mg by mouth daily. Pt takes 1/2 tab    [provider]  Misc Natural Products (GREEN FOOD COMPLEX PO) Take 2 capsules by mouth daily.    [provider]  Multiple Vitamins-Minerals (ZINC PO) Take 15 mg by mouth daily. Pt takes 1/2 tab    [provider]  NIACIN PO Take 20 mg by mouth daily. Pt takes 1/2 tab    [provider]  olmesartan (BENICAR) 20 MG tablet Take 2 tablets (40 mg total) by mouth daily. 07/24/22   Parke Poisson, MD  omega-3 acid ethyl esters (LOVAZA) 1 g capsule Take 2 g by mouth daily.    [provider]  Omega-3 Fatty Acids (EPA PO) Take by mouth.    [provider]  PANTOTHENIC ACID PO Take 10 mg by mouth. Pt take 1/2 tab    [provider]  Probiotic Product (PROBIOTIC PO) Take by mouth daily.    [provider]  Quercetin 50 MG TABS Take 50 mg by mouth daily. Pt takes 1/2 tab    [provider]  RESVERATROL PO Take 50 mg by mouth daily. Pt takes 1/2 tab    [provider]  rosuvastatin (CRESTOR) 10 MG tablet Take 1 tablet (10 mg total) by mouth daily. 03/14/23   Parke Poisson, MD  SELENIUM PO Take 70 mcg by mouth daily. Pt takes 1/2 tab    [provider]  Thiamine HCl (THIAMINE PO) Take 3 mg by mouth daily. Pt takes 1/2 tab    [provider]     Allergies:    Allergies  Allergen Reactions   Arimidex [Anastrozole] Rash   Latex Rash    Social History:   Social History   Socioeconomic History   Marital status: Divorced    Spouse name: Not on file   Number of children: Not on file   Years of education: Not on file   Highest education level: Not on file  Occupational History   Not on file  Tobacco Use   Smoking status: Not on file   Smokeless tobacco: Not on file  Substance and Sexual Activity   Alcohol use: Not on file   Drug use: Not on file   Sexual  activity: Not on file  Other Topics Concern   Not on file  Social History Narrative   Not on file   Social Determinants of Health   Financial Resource Strain: Not on file  Food Insecurity: Not on file  Transportation Needs: Not on file  Physical Activity: Not on file  Stress: Not on file  Social Connections: Not on file  Intimate Partner Violence: Not on file  Family History:   The patient's family history includes Heart attack in her brother.    ROS:  Please see the history of present illness.  All other ROS reviewed and negative.     Physical Exam/Data:   Vitals:   03/25/23 0827 03/25/23 1000  BP: 127/89 125/62  Pulse: 61 (!) 42  Resp: 18 16  Temp: 97.8 F (36.6 C)   TempSrc: Oral   SpO2: 100% 97%   No intake or output data in the 24 hours ending 03/25/23 1024    03/20/2023    8:34 PM 03/20/2023    3:45 PM 07/24/2022    8:34 AM  Last 3 Weights  Weight (lbs) 120 lb 13 oz 121 lb 125 lb 3.2 oz  Weight (kg) 54.8 kg 54.885 kg 56.79 kg     There is no height or weight on file to calculate BMI.  General:  Well nourished, well developed, in no acute distress HEENT: normal Neck: no JVD Vascular: No carotid bruits; Distal pulses 2+ bilaterally   Cardiac:  RRR; no murmurs, gallops or rubs Lungs:  CTA b/l, no wheezing, rhonchi or rales  Abd: soft, nontender  Ext: no edema Musculoskeletal:  No deformities, BUE and BLE strength normal and equal Skin: warm and dry  Neuro:  no focal abnormalities noted Psych:  Normal affect    EKG:  personally reviewed and demonstrates   TODAY SR with 2:1 AV block, RBBB (known for her)  OLD 03/21/23: Sb 59bpm, RBBB SR 66bppm, Mobitz I with only slight PR prolongation noted 07/24/22: SR 65bpm, RBBB   MONITOR SR with several episodes of CHB associated with V pauses: 7.6 seconds at 6:29PM 3:1 AVblock and CHB with V rates 29-30 04:57 and  05:02 CHB/V standstill 11.6 seconds  Relevant CV Studies:  Cath: 03/21/2023 1.  No  significant obstructive coronary artery disease of codominant system. 2.  LVEDP of 4 mmHg.    Echo 08/22/2022  1. Left ventricular ejection fraction, by estimation, is 60 to 65%. The  left ventricle has normal function. The left ventricle has no regional  wall motion abnormalities. Left ventricular diastolic parameters are  indeterminate. The average left  ventricular global longitudinal strain is -25.3 %. The global longitudinal  strain is normal.   2. Right ventricular systolic function is normal. The right ventricular  size is normal. There is normal pulmonary artery systolic pressure.   3. Left atrial size was moderately dilated.   4. The mitral valve is degenerative. Mild mitral valve regurgitation. No  evidence of mitral stenosis. Severe mitral annular calcification.   5. The aortic valve is tricuspid. There is moderate calcification of the  aortic valve. There is moderate thickening of the aortic valve. Aortic  valve regurgitation is not visualized. Aortic valve sclerosis is present,  with no evidence of aortic valve  stenosis.   6. The inferior vena cava is normal in size with greater than 50%  respiratory variability, suggesting right atrial pressure of 3 mmHg.   Laboratory Data:  High Sensitivity Troponin:   Recent Labs  Lab 03/20/23 1657 03/20/23 1820 03/25/23 0831  TROPONINIHS 33* 33* 11      Chemistry Recent Labs  Lab 03/21/23 0026 03/25/23 0831  NA 137 140  K 3.1* 4.0  CL 104 101  CO2 24 23  GLUCOSE 108* 138*  BUN 8 26*  CREATININE 0.60 0.87  CALCIUM 8.8* 9.9  MG  --  1.9  GFRNONAA >60 >60  ANIONGAP 9 16*  Recent Labs  Lab 03/20/23 1657  PROT 6.4*  ALBUMIN 3.5  AST 22  ALT 16  ALKPHOS 59  BILITOT 0.6   Lipids  Recent Labs  Lab 03/21/23 0026  CHOL 198  TRIG 54  HDL 116  LDLCALC 71  CHOLHDL 1.7   Hematology Recent Labs  Lab 03/20/23 1657 03/25/23 0831  WBC 7.8 9.3  RBC 4.17 4.19  HGB 14.1 13.7  HCT 41.8 42.0  MCV 100.2*  100.2*  MCH 33.8 32.7  MCHC 33.7 32.6  RDW 13.0 13.1  PLT 205 232   Thyroid  Recent Labs  Lab 03/20/23 1820  TSH 3.043   BNPNo results for input(s): "BNP", "PROBNP" in the last 168 hours.  DDimer No results for input(s): "DDIMER" in the last 168 hours.   Radiology/Studies:  DG Chest 2 View  Result Date: 03/25/2023 CLINICAL DATA:  Central chest pressure EXAM: CHEST - 2 VIEW COMPARISON:  Chest radiograph dated 03/20/2023 FINDINGS: Normal lung volumes. No focal consolidations. Trace bilateral pleural effusions. Increased cardiomegaly. No acute osseous abnormality. Left lateral chest wall surgical clips. Implanted device within the left anterior chest wall. IMPRESSION: 1. Increased cardiomegaly. 2. Trace bilateral pleural effusions. Electronically Signed   By: Agustin Cree M.D.   On: 03/25/2023 09:39     Assessment and Plan:   Recurrent dizziness Recurrent advanced heart block including CHB No reversible cause No obstructive CAD by cath last week Preserved LVEF by echo Nov 2023   Currently 2:1 AV block and asymptomatic with stable BP  She will need PPM Discussed rational for PPM for her, the implant procedure and potential risks/benefits She is agreeable to proceed.  EP MD will see      Risk Assessment/Risk Scores:    For questions or updates, please contact [facility] Please consult [email or web address] for contact info under     Signed, Sheilah Pigeon, PA-C  03/25/2023 10:24 AM   EP Attending  Patient seen and examined. Agree with the findings as noted above. The patient is a pleasant 77 yo woman with a h/o HTN, Right BBB, prior breast CA and XRT, who presented with Stokes Adams attacks and found to have long pauses on her Zio monitor. She is presently in 2:1 AV block and on exam she is well appearing, NAD. Lungs are clear and CV reveals a regular brady. No edema. Skin is warm. Neuro is non-focal. Tele as above. 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  Sharlot Gowda Jalonda Antigua,MD

## 2023-03-25 NOTE — ED Provider Notes (Signed)
[place] EMERGENCY DEPARTMENT AT [facility] Provider Note   CSN: [id number] Arrival date & time: 03/25/23  0818     History  Chief Complaint  Patient presents with   Chest Pressure   Dizziness    Kim Moreno is a 77 y.o. female.   Dizziness  Patient is a 77 year old female with a past medical history significant for mitral murmur, HTN, HLD, RBBB, coronary artery calcification, recent admission for Mobitz type II w EMS but on arrival to ER she had resolution of her AV block and has not had any AV block during admission.  She states that she has had several brief episodes of feeling lightheaded since she was discharged from the hospital but states that this morning she has felt persistently lightheaded and is felt a chest pressure.  She does not feel dyspneic, no cough she is not coughing up blood.  No fevers or chills  She is taking her prescribed medications, denies any alcohol or recreational drug use    Home Medications Prior to Admission medications   Medication Sig Start Date End Date Taking? Authorizing Provider  Alpha-Lipoic Acid 100 MG TABS Take 100 mg by mouth daily. Pt takes 1/2 tab    [provider]  amLODipine (NORVASC) 10 MG tablet Take 1 tablet (10 mg total) by mouth daily. 07/24/22   Parke Poisson, MD  aspirin 81 MG chewable tablet Chew 81 mg by mouth daily.    [provider]  Beta Carotene (VITAMIN A) 25000 UNIT capsule Take 7,000 Units by mouth daily.    [provider]  Co-Enzyme Q-10 100 MG CAPS Take 100 mg by mouth daily. Pt takes 1/2 tab    [provider]  Copper Gluconate (COPPER CAPS PO) Take 5 mg by mouth daily. Pt takes 1/2 tab    [provider]  Docosahexaenoic Acid (DHA PO) Take 300 mg by mouth daily. Pt takes 1/2 tab    [provider]  ergocalciferol (DRISDOL) 200 MCG/ML drops Take 4,000 Units by mouth daily.    [provider]  IRON-VITAMINS PO Take 6 mg by mouth.     [provider]  LUTEIN PO Take 3 mg by mouth daily. Pt takes 1/2 tab    [provider]  LYCOPENE PO Take 1 mg by mouth daily. Pt takes 1/2 tab    [provider]  MAGNESIUM PO Take by mouth. Pt takes 1/2 tablet    [provider]  Milk Thistle-Turmeric (SILYMARIN PO) Take 100 mg by mouth daily. Pt takes 1/2 tab    [provider]  Misc Natural Products (GREEN FOOD COMPLEX PO) Take 2 capsules by mouth daily.    [provider]  Multiple Vitamins-Minerals (ZINC PO) Take 15 mg by mouth daily. Pt takes 1/2 tab    [provider]  NIACIN PO Take 20 mg by mouth daily. Pt takes 1/2 tab    [provider]  olmesartan (BENICAR) 20 MG tablet Take 2 tablets (40 mg total) by mouth daily. 07/24/22   Parke Poisson, MD  omega-3 acid ethyl esters (LOVAZA) 1 g capsule Take 2 g by mouth daily.    [provider]  Omega-3 Fatty Acids (EPA PO) Take by mouth.    [provider]  PANTOTHENIC ACID PO Take 10 mg by mouth. Pt take 1/2 tab    [provider]  Probiotic Product (PROBIOTIC PO) Take by mouth daily.    [provider]  Quercetin 50 MG TABS Take 50 mg by mouth daily. Pt takes 1/2 tab    [provider]  RESVERATROL PO Take 50 mg by mouth daily. Pt takes 1/2 tab    [provider]  rosuvastatin (CRESTOR) 10 MG tablet Take 1 tablet (10 mg total) by mouth daily. 03/14/23   Parke Poisson, MD  SELENIUM PO Take 70 mcg by mouth daily. Pt takes 1/2 tab    [provider]  Thiamine HCl (THIAMINE PO) Take 3 mg by mouth daily. Pt takes 1/2 tab    [provider]      Allergies    Arimidex [anastrozole] and Latex    Review of Systems   Review of Systems  Neurological:  Positive for dizziness.    Physical Exam Updated Vital Signs BP 125/62   Pulse (!) 42   Temp 97.8 F (36.6 C) (Oral)   Resp 16   SpO2 97%  Physical Exam Vitals and nursing note  reviewed.  Constitutional:      General: She is not in acute distress.    Comments: Pleasant well-appearing 77 year old.  In no acute distress.  Sitting comfortably in bed.  Able answer questions appropriately follow commands. No increased work of breathing. Speaking in full sentences.   HENT:     Head: Normocephalic and atraumatic.     Nose: Nose normal.     Mouth/Throat:     Mouth: Mucous membranes are moist.  Eyes:     General: No scleral icterus. Cardiovascular:     Rate and Rhythm: Regular rhythm. Bradycardia present.     Pulses: Normal pulses.     Heart sounds: Normal heart sounds.     Comments: 30-40 by palp Pulmonary:     Effort: Pulmonary effort is normal. No respiratory distress.     Breath sounds: No wheezing.  Abdominal:     Palpations: Abdomen is soft.     Tenderness: There is no abdominal tenderness.  Musculoskeletal:     Cervical back: Normal range of motion.     Right lower leg: No edema.     Left lower leg: No edema.  Skin:    General: Skin is warm and dry.     Capillary Refill: Capillary refill takes less than 2 seconds.  Neurological:     Mental Status: She is alert. Mental status is at baseline.  Psychiatric:        Mood and Affect: Mood normal.        Behavior: Behavior normal.     ED Results / Procedures / Treatments   Labs (all labs ordered are listed, but only abnormal results are displayed) Labs Reviewed  BASIC METABOLIC PANEL - Abnormal; Notable for the following components:      Result Value   Glucose, Bld 138 (*)    BUN 26 (*)    Anion gap 16 (*)    All other components within normal limits  CBC - Abnormal; Notable for the following components:   MCV 100.2 (*)    All other components within normal limits  MAGNESIUM  TROPONIN I (HIGH SENSITIVITY)  TROPONIN I (HIGH SENSITIVITY)    EKG None  Radiology DG Chest 2 View  Result Date: 03/25/2023 CLINICAL DATA:  Central chest pressure EXAM: CHEST - 2 VIEW COMPARISON:  Chest radiograph  dated 03/20/2023 FINDINGS: Normal lung volumes. No focal consolidations. Trace bilateral pleural effusions. Increased cardiomegaly. No acute osseous abnormality. Left lateral chest wall surgical clips. Implanted device within the left anterior  chest wall. IMPRESSION: 1. Increased cardiomegaly. 2. Trace bilateral pleural effusions. Electronically Signed   By: Agustin Cree M.D.   On: 03/25/2023 09:39    Procedures .Critical Care  Performed by: Gailen Shelter, PA Authorized by: Gailen Shelter, PA   Critical care provider statement:    Critical care time (minutes):  35   Critical care time was exclusive of:  Separately billable procedures and treating other patients and teaching time   Critical care was necessary to treat or prevent imminent or life-threatening deterioration of the following conditions:  Cardiac failure   Critical care was time spent personally by me on the following activities:  Development of treatment plan with patient or surrogate, review of old charts, re-evaluation of patient's condition, pulse oximetry, ordering and review of radiographic studies, ordering and review of laboratory studies, ordering and performing treatments and interventions, obtaining history from patient or surrogate, examination of patient and evaluation of patient's response to treatment   Care discussed with: admitting provider       Medications Ordered in ED Medications - No data to display  ED Course/ Medical Decision Making/ A&P                             Medical Decision Making Amount and/or Complexity of Data Reviewed Labs: ordered. Radiology: ordered.   This patient presents to the ED for concern of near syncope, this involves a number of treatment options, and is a complaint that carries with it a moderate risk of complications and morbidity. A differential diagnosis was considered for the patient's symptoms which is discussed below:   The differential diagnosis for syncope includes  (but is not limited to) anemia, hypoglycemia, arrhythmia, seizure.  If associated with pain loss of consciousness can certainly also include subarachnoid hemorrhage, thoracic aortic dissection, PE, ectopic pregnancy, AAA.  Also with considering is a vasovagal reaction, hypovolemia/dehydration.    Co morbidities: Discussed in HPI   Brief History:  Patient is a 77 year old female with a past medical history significant for mitral murmur, HTN, HLD, RBBB, coronary artery calcification, recent admission for Mobitz type II w EMS but on arrival to ER she had resolution of her AV block and has not had any AV block during admission.  She states that she has had several brief episodes of feeling lightheaded since she was discharged from the hospital but states that this morning she has felt persistently lightheaded and is felt a chest pressure.  She does not feel dyspneic, no cough she is not coughing up blood.  No fevers or chills  She is taking her prescribed medications, denies any alcohol or recreational drug use    EMR reviewed including pt PMHx, past surgical history and past visits to ER.   See HPI for more details   Lab Tests:  I ordered and independently interpreted labs. Labs notable for Magnesium normal, BMP normal, CBC without leukocytosis or anemia, TSH and second troponin pending  Imaging Studies:  NAD. I personally reviewed all imaging studies and no acute abnormality found. I agree with radiology interpretation.  Slight cardiomegaly since last exam.  Cardiac Monitoring:  The patient was maintained on a cardiac monitor.  I personally viewed and interpreted the cardiac monitored which showed an underlying rhythm of: Initially 3-1 or possibly 4-1 Mobitz type II however changed to 2-1 on monitor.  EKG repeated EKG non-ischemic initial EKG shows 3 or 4-1 Mobitz type II changed to 2  to one of his type II on repeat EKG.  Heart rate seems to have improved slightly.   Medicines  ordered:    Critical Interventions:     Consults/Attending Physician   I discussed this case with my attending physician who cosigned this note including patient's presenting symptoms, physical exam, and planned diagnostics and interventions. Attending physician stated agreement with plan or made changes to plan which were implemented.  Discussed with Dr. Bufford Buttner of cardiology service who will admit to his service.   Reevaluation:  After the interventions noted above I re-evaluated patient and found that they have :stayed the same   Social Determinants of Health:      Problem List / ED Course:  Patient with secondary type II heart block.  Not on any rate control medications.  Cardiology will admit.  She is experiencing primarily lightheadedness although she is experiencing some chest pressure normal troponin x 1, no ischemic changes on EKG    Dispostion:  After consideration of the diagnostic results and the patients response to treatment, I feel that the patent would benefit from admission.     Final Clinical Impression(s) / ED Diagnoses Final diagnoses:  Mobitz type 2 second degree AV block    Rx / DC Orders ED Discharge Orders     None         Gailen Shelter, [place] 03/25/23 1023    Gerhard Munch, MD 03/25/23 1537

## 2023-03-25 NOTE — Progress Notes (Signed)
Patient reports she sleep walk at night. Patient requested to have all four rails up and the bed alarm on.

## 2023-03-25 NOTE — ED Triage Notes (Addendum)
Pt came in via POV d/t feeling a central chest pressure since waking up this morning with no radiation. States she has been wearing a heart monitor & after it went "off" this morning she was advised by her provider to come into ED. A/Ox4, reports some dizzy feelings while en route to ED, she is intermittently bradycardic while in triage.

## 2023-03-25 NOTE — Telephone Encounter (Signed)
IRhythm called with continued alerts. Complete heart block Ventricular Systole. Lasting 3.3 seconds to 7.7 seconds with average HR 20's   Per Azalee Course, [place]  regarding HOLD of alerts for 24 hours  Sure. that's fine. she is already in the hospital and probably hooked up to telemetry at this point HM in the next few hours, we should have a plan whether to DC the monitor permanently or not   This information given to April at [facility] If monitor is Dc'd, can call at [phone]  Ticket number [phone]

## 2023-03-25 NOTE — ED Notes (Signed)
Assist patient on the bedpan patient is now off on the monitor with family at bedside and call bell, in reach

## 2023-03-25 NOTE — ED Notes (Signed)
Pt placed on pacer pads.

## 2023-03-25 NOTE — Telephone Encounter (Signed)
I spoke with Dr. Lalla Brothers, given prior history of near syncope that led to the last admission and multiple pauses and a nonconducted P wave, patient will likely need a pacemaker.  Recommend she go to the emergency room at [facility] for further evaluation.  EKG strips received at [facility].

## 2023-03-25 NOTE — Telephone Encounter (Signed)
Caller reporting abnormal results.   Ref#  [id number]

## 2023-03-25 NOTE — Telephone Encounter (Signed)
Zio by Meredeth Ide is calling to give critical results.

## 2023-03-25 NOTE — ED Notes (Signed)
ED TO INPATIENT HANDOFF REPORT  ED Nurse Name and Phone #: Dot Lanes, paramedic [phone]  S Name/Age/Gender Kim Moreno 77 y.o. female Room/Bed: 021C/021C  Code Status   Code Status: Prior  Home/SNF/Other Home Patient oriented to: self, place, time, and situation Is this baseline? Yes   Triage Complete: Triage complete  Chief Complaint heart monitor alerts dizziness irregular heart beat  Triage Note Pt came in via POV d/t feeling a central chest pressure since waking up this morning with no radiation. States she has been wearing a heart monitor & after it went "off" this morning she was advised by her provider to come into ED. A/Ox4, reports some dizzy feelings while en route to ED, she is intermittently bradycardic while in triage.     Allergies Allergies  Allergen Reactions   Arimidex [Anastrozole] Rash   Latex Rash    Level of Care/Admitting Diagnosis ED Disposition     ED Disposition  Admit   Condition  --   Comment  The patient appears reasonably stabilized for admission considering the current resources, flow, and capabilities available in the ED at this time, and I doubt any other [facility] requiring further screening and/or treatment in the ED prior to admission is  present.          B Medical/Surgery History Past Medical History:  Diagnosis Date   Breast cancer (HCC) 2007   Personal history of radiation therapy 2008   left breast   Past Surgical History:  Procedure Laterality Date   BREAST LUMPECTOMY Left 2007   LEFT HEART CATH AND CORONARY ANGIOGRAPHY N/A 03/21/2023   Procedure: LEFT HEART CATH AND CORONARY ANGIOGRAPHY;  Surgeon: Orbie Pyo, MD;  Location: [facility] INVASIVE CV LAB;  Service: Cardiovascular;  Laterality: N/A;   PARATHYROIDECTOMY     2000 approx     A IV Location/Drains/Wounds Patient Lines/Drains/Airways Status     Active Line/Drains/Airways     Name Placement date Placement time Site Days   Peripheral IV 03/25/23 20 G  Anterior;Left;Proximal Forearm 03/25/23  0937  Forearm  less than 1            Intake/Output Last 24 hours No intake or output data in the 24 hours ending 03/25/23 1214  Labs/Imaging Results for orders placed or performed during the hospital encounter of 03/25/23 (from the past 48 hour(s))  Basic metabolic panel     Status: Abnormal   Collection Time: 03/25/23  8:31 AM  Result Value Ref Range   Sodium 140 135 - 145 mmol/L   Potassium 4.0 3.5 - 5.1 mmol/L   Chloride 101 98 - 111 mmol/L   CO2 23 22 - 32 mmol/L   Glucose, Bld 138 (H) 70 - 99 mg/dL    Comment: Glucose reference range applies only to samples taken after fasting for at least 8 hours.   BUN 26 (H) 8 - 23 mg/dL   Creatinine, Ser 1.47 0.44 - 1.00 mg/dL   Calcium 9.9 8.9 - 82.9 mg/dL   GFR, Estimated >56 >21 mL/min    Comment: (NOTE) Calculated using the CKD-EPI Creatinine Equation (2021)    Anion gap 16 (H) 5 - 15    Comment: Performed at [facility], [address], [place], [place] [address]  CBC     Status: Abnormal   Collection Time: 03/25/23  8:31 AM  Result Value Ref Range   WBC 9.3 4.0 - 10.5 K/uL   RBC 4.19 3.87 - 5.11 MIL/uL   Hemoglobin 13.7  12.0 - 15.0 g/dL   HCT 11.9 14.7 - 82.9 %   MCV 100.2 (H) 80.0 - 100.0 fL   MCH 32.7 26.0 - 34.0 pg   MCHC 32.6 30.0 - 36.0 g/dL   RDW 56.2 13.0 - 86.5 %   Platelets 232 150 - 400 K/uL   nRBC 0.0 0.0 - 0.2 %    Comment: Performed at [facility], [address], [place], [place] [address]  Troponin I (High Sensitivity)     Status: None   Collection Time: 03/25/23  8:31 AM  Result Value Ref Range   Troponin I (High Sensitivity) 11 <18 ng/L    Comment: (NOTE) Elevated high sensitivity troponin I (hsTnI) values and significant  changes across serial measurements may suggest ACS but many other  chronic and acute conditions are known to elevate hsTnI results.  Refer to the "Links" section for chest pain algorithms and additional  guidance. Performed  at [facility], [address] [place], [place] [address]   Magnesium     Status: None   Collection Time: 03/25/23  8:31 AM  Result Value Ref Range   Magnesium 1.9 1.7 - 2.4 mg/dL    Comment: Performed at [facility], [address] [place], [place] [address]  TSH     Status: None   Collection Time: 03/25/23 10:30 AM  Result Value Ref Range   TSH 2.715 0.350 - 4.500 uIU/mL    Comment: Performed by a 3rd Generation assay with a functional sensitivity of <=0.01 uIU/mL. Performed at [facility], [address] [place], [place] [address]    DG Chest 2 View  Result Date: 03/25/2023 CLINICAL DATA:  Central chest pressure EXAM: CHEST - 2 VIEW COMPARISON:  Chest radiograph dated 03/20/2023 FINDINGS: Normal lung volumes. No focal consolidations. Trace bilateral pleural effusions. Increased cardiomegaly. No acute osseous abnormality. Left lateral chest wall surgical clips. Implanted device within the left anterior chest wall. IMPRESSION: 1. Increased cardiomegaly. 2. Trace bilateral pleural effusions. Electronically Signed   By: Agustin Cree M.D.   On: 03/25/2023 09:39    Pending Labs Unresulted Labs (From admission, onward)     Start     Ordered   Signed and Held  Surgical PCR screen  (Screening)  Once,   R        Signed and Held            Vitals/Pain Today's Vitals   03/25/23 0827 03/25/23 1000 03/25/23 1100 03/25/23 1200  BP: 127/89 125/62 (!) 134/50 119/60  Pulse: 61 (!) 42 (!) 43 (!) 41  Resp: 18 16 15 14   Temp: 97.8 F (36.6 C)     TempSrc: Oral     SpO2: 100% 97% 98% 96%  PainSc:        Isolation Precautions No active isolations  Medications Medications  sodium chloride flush (NS) 0.9 % injection 3 mL (has no administration in time range)    Mobility walks     Focused Assessments     R Recommendations: See Admitting Provider Note  Report given to:   Additional Notes:

## 2023-03-25 NOTE — Telephone Encounter (Signed)
Received handoff from the overnight fellow.  Spoke with Kim Moreno, [phone], reference [id number]), according to [facility], there was a 11.1-second asystole occurring on 6/30 at 6:29 PM.  Apparently she had 3 to 5-second pauses during sleep overnight as well.  Around 5:02 AM this morning she had another 2 episode of prolonged pauses, first episode lasted 11 seconds, this was immediately followed by another 3-second pause.  Awaiting on the rhythm strip from [facility].  All the Time Loraine Leriche are [place] standard time according to [place].  Per [employer] report, they contacted the patient around 5:08 AM this morning, patient says she woke up around 5:02 AM feeling her heart was slow.  I have requested prescription to be sent to [facility] to be reviewed.  I plans to call the patient once received strips to review.

## 2023-03-25 NOTE — Telephone Encounter (Signed)
Received 2nd call from [facility] regarding patient having a 3 sec pause.  Informed individual to send rhythm strips for further evaluation.

## 2023-03-26 ENCOUNTER — Encounter (HOSPITAL_COMMUNITY): Payer: Self-pay | Admitting: Internal Medicine

## 2023-03-26 DIAGNOSIS — I441 Atrioventricular block, second degree: Secondary | ICD-10-CM | POA: Diagnosis not present

## 2023-03-26 DIAGNOSIS — I442 Atrioventricular block, complete: Secondary | ICD-10-CM | POA: Diagnosis not present

## 2023-03-26 NOTE — Progress Notes (Signed)
Explained discharge instructions to patient. Reviewed follow up appointment and next medication administration times. Also reviewed education. Patient verbalized having an understanding for instructions given. All belongings are in the patient's possession.. IV and telemetry were removed by floor staff.  No other needs verbalized. Transported downstairs for discharge. 

## 2023-03-26 NOTE — Discharge Summary (Addendum)
ELECTROPHYSIOLOGY PROCEDURE DISCHARGE SUMMARY    Patient ID: Kim Moreno,  MRN: [id number], DOB/AGE: 05/19/1946 77 y.o.  Admit date: 03/25/2023 Discharge date: 03/26/2023  Primary Care Physician: Merri Brunette, MD  Primary Cardiologist: Dr. Jacques Navy Electrophysiologist: new, Dr. Ladona Ridgel  Primary Discharge Diagnosis:  Advanced heart block CHB Symptomatic bradycardia  Secondary Discharge Diagnosis:  HTN RBBB Breast cancer (remote)  Allergies  Allergen Reactions   Arimidex [Anastrozole] Rash   Latex Rash     Procedures This Admission:  1.  Implantation of a Abbott dual chamber PPM on 03/25/23 by Dr Ladona Ridgel.   There were no immediate post procedure complications. CXR on 03/25/23 post procedure demonstrated no pneumothorax status post device implantation.   Brief HPI: Kim Moreno is a 77 y.o. female was recently admitted 03/20/23 with c/o dizziness for a couple days, feeling foggy, intermittently, the day of admission she was walking onto a restaurant when she became very dizzy, and had to squat down until feeling better, did not have syncope. She also reported some intermittent chest pain as well. She had her lunch went home and decided to call the office and advised to go to go to the ER Apparently calling EMS and was reported to have been in 2:1 AVblock, also noted in the ER without symptoms.  She was not on a nodal blocking agent and planned for LHC. She had no significant CAD, no further AV block was noted and discharged 03/21/23 with [employer] monitor   Overnight 6/30- early 03/25/23 [employer] company notified of recurrent bradycardia/pauses with reports of: 11.1-second asystole occurring on 6/30 at 6:29 PM.  Apparently she had 3 to 5-second pauses during sleep overnight as well.  Around 5:02 AM this morning she had another 2 episode of prolonged pauses, first episode lasted 11 seconds, this was immediately followed by another 3-second pause. She was symptomatic and advise dto come to  the ER   Hospital Course:  The patient was admitted no reversible causes for her heart block noted and underwent implantation of a PPM with details as outlined in the procedure report.  She was monitored on telemetry overnight which demonstrated SR with intermittent SR/V pacing.  Left chest was without hematoma or ecchymosis.  The device was interrogated and found to be functioning normally.  CXR was obtained and demonstrated no pneumothorax status post device implantation.  Wound care, arm mobility, and restrictions were reviewed with the patient.  The patient feels well, denies any CP/SOB, with minimal site discomfort.  She was examined by Dr. Ladona Ridgel and considered stable for discharge to home.    Physical Exam: Vitals:   03/25/23 1926 03/25/23 2341 03/26/23 0350 03/26/23 0729  BP: 108/62 115/64 (!) 117/58 115/65  Pulse: 70 65 62 66  Resp: 18 18 18 15   Temp: 98.2 F (36.8 C) 97.6 F (36.4 C) 98 F (36.7 C) 98 F (36.7 C)  TempSrc: Oral Oral Oral Oral  SpO2: 95% 92% 92% 96%    GEN- The patient is well appearing, alert and oriented x 3 today.   HEENT: normocephalic, atraumatic; sclera clear, conjunctiva pink; hearing intact; oropharynx clear; neck supple, no JVP Lungs- CTA b/l, normal work of breathing.  No wheezes, rales, rhonchi Heart- RRR, no murmurs, rubs or gallops, PMI not laterally displaced GI- soft, non-tender, non-distended Extremities- no clubbing, cyanosis, or edema MS- no significant deformity or atrophy Skin- warm and dry, no rash or lesion, left chest without hematoma/ecchymosis Psych- euthymic mood, full affect Neuro- no gross  deficits   Labs:   Lab Results  Component Value Date   WBC 9.3 03/25/2023   HGB 13.7 03/25/2023   HCT 42.0 03/25/2023   MCV 100.2 (H) 03/25/2023   PLT 232 03/25/2023    Recent Labs  Lab 03/20/23 1657 03/21/23 0026 03/25/23 0831  NA 137   < > 140  K 3.5   < > 4.0  CL 101   < > 101  CO2 23   < > 23  BUN 17   < > 26*   CREATININE 0.75   < > 0.87  CALCIUM 9.7   < > 9.9  PROT 6.4*  --   --   BILITOT 0.6  --   --   ALKPHOS 59  --   --   ALT 16  --   --   AST 22  --   --   GLUCOSE 131*   < > 138*   < > = values in this interval not displayed.    Discharge Medications:  Allergies as of 03/26/2023       Reactions   Arimidex [anastrozole] Rash   Latex Rash        Medication List     TAKE these medications    Alpha-Lipoic Acid 100 MG Tabs Take 100 mg by mouth daily. Pt takes 1/2 tab   amLODipine 10 MG tablet Commonly known as: NORVASC Take 1 tablet (10 mg total) by mouth daily.   aspirin 81 MG chewable tablet Chew 81 mg by mouth daily.   CITRACAL MAXIMUM PLUS PO   Co-Enzyme Q-10 100 MG Caps Take 100 mg by mouth daily. Pt takes 1/2 tab   COPPER CAPS PO Take 5 mg by mouth daily. Pt takes 1/2 tab   DHA PO Take 300 mg by mouth daily. Pt takes 1/2 tab   EPA PO Take by mouth.   ergocalciferol (VITAMIN D2) 200 MCG/ML drops Commonly known as: DRISDOL Take 4,000 Units by mouth daily.   GREEN FOOD COMPLEX PO Take 2 capsules by mouth daily.   IRON-VITAMINS PO Take 6 mg by mouth.   LUTEIN PO Take 3 mg by mouth daily. Pt takes 1/2 tab   LYCOPENE PO Take 1 mg by mouth daily. Pt takes 1/2 tab   MAGNESIUM PO Take by mouth. Pt takes 1/2 tablet   NIACIN PO Take 20 mg by mouth daily. Pt takes 1/2 tab   olmesartan 20 MG tablet Commonly known as: BENICAR Take 2 tablets (40 mg total) by mouth daily.   omega-3 acid ethyl esters 1 g capsule Commonly known as: LOVAZA Take 2 g by mouth daily.   PANTOTHENIC ACID PO Take 10 mg by mouth. Pt take 1/2 tab   PROBIOTIC PO Take by mouth daily.   Quercetin 50 MG Tabs Take 50 mg by mouth daily. Pt takes 1/2 tab   RESVERATROL PO Take 50 mg by mouth daily. Pt takes 1/2 tab   rosuvastatin 10 MG tablet Commonly known as: CRESTOR Take 1 tablet (10 mg total) by mouth daily.   SELENIUM PO Take 70 mcg by mouth daily. Pt takes 1/2 tab    SILYMARIN PO Take 100 mg by mouth daily. Pt takes 1/2 tab   THIAMINE PO Take 3 mg by mouth daily. Pt takes 1/2 tab   vitamin A 45409 UNIT capsule Take 7,000 Units by mouth daily.   ZINC PO Take 15 mg by mouth daily. Pt takes 1/2 tab  Disposition: Home Discharge Instructions     Diet - low sodium heart healthy   Complete by: As directed    Increase activity slowly   Complete by: As directed         Duration of Discharge Encounter: Greater than 30 minutes including physician time.  Norma Fredrickson, PA-C 03/26/2023 8:44 AM  EP Attending  Patient seen and examined. Agree with the findings as noted above. The patient is doing well after insertion of a DDD PM. Interrogation of her device under my direction demonstrates  normal DDD PM function and CXR demonstrates normal device function. She will be discharged home with usual followup.  Sharlot Gowda Symeon Puleo,MD

## 2023-03-26 NOTE — Progress Notes (Signed)
I don't think her rotating GI symptom is cardiac in nature. No coronary artery disease to explain her heart burn. However I was able to reach out to her yesterday morning since her heart monitor was very abnormal with multiple pauses, I sent her to the ED, she eventually got a pacemaker. But I don't think her diarrhea has anything to do with her irregular rhythm either. I suspect her rotating diarrhea and constipation is a purely GI issue.

## 2023-03-26 NOTE — Telephone Encounter (Signed)
Patient ended up having a pacemaker, may discontinue the heart monitor now. She no longer need it

## 2023-03-26 NOTE — Telephone Encounter (Signed)
Spoke with Kim Moreno at [facility] and she states being that the monitor was removed from patient it just needs to be sent back in return box.  Called patient and she is aware all she needs to do is monitor back with the return label received.   Would you still like for them to send end of summary results from the monitor?

## 2023-03-26 NOTE — Discharge Instructions (Signed)
After Your Pacemaker   You have a Abbott Pacemaker  ACTIVITY Do not lift your arm above shoulder height for 1 week after your procedure. After 7 days, you may progress as below.  You should remove your sling 24 hours after your procedure, unless otherwise instructed by your provider.     Tuesday April 02, 2023  Wednesday April 03, 2023 Thursday April 04, 2023 Friday April 05, 2023   Do not lift, push, pull, or carry anything over 10 pounds with the affected arm until 6 weeks (Tuesday May 07, 2023 ) after your procedure.   You may drive AFTER your wound check, unless you have been told otherwise by your provider.   Ask your healthcare provider when you can go back to work   INCISION/Dressing  Monitor your Pacemaker site for redness, swelling, and drainage. Call the device clinic at [phone] if you experience these symptoms or fever/chills.  If your incision is sealed with Steri-strips or staples, you may shower 7 days after your procedure or when told by your provider. Do not remove the steri-strips or let the shower hit directly on your site. You may wash around your site with soap and water.    Avoid lotions, ointments, or perfumes over your incision until it is well-healed.  You may use a hot tub or a pool AFTER your wound check appointment if the incision is completely closed.  Pacemaker Alerts:  Some alerts are vibratory and others beep. These are NOT emergencies. Please call our office to let us know. If this occurs at night or on weekends, it can wait until the next business day. Send a remote transmission.  If your device is capable of reading fluid status (for heart failure), you will be offered monthly monitoring to review this with you.   DEVICE MANAGEMENT Remote monitoring is used to monitor your pacemaker from home. This monitoring is scheduled every 91 days by our office. It allows [place] to keep an eye on the functioning of your device to ensure it is working properly.  You will routinely see your Electrophysiologist annually (more often if necessary).   You should receive your ID card for your new device in 4-8 weeks. Keep this card with you at all times once received. Consider wearing a medical alert bracelet or necklace.  Your Pacemaker may be MRI compatible. This will be discussed at your next office visit/wound check.  You should avoid contact with strong electric or magnetic fields.   Do not use amateur (ham) radio equipment or electric (arc) welding torches. MP3 player headphones with magnets should not be used. Some devices are safe to use if held at least 12 inches (30 cm) from your Pacemaker. These include power tools, lawn mowers, and speakers. If you are unsure if something is safe to use, ask your health care provider.  When using your cell phone, hold it to the ear that is on the opposite side from the Pacemaker. Do not leave your cell phone in a pocket over the Pacemaker.  You may safely use electric blankets, heating pads, computers, and microwave ovens.  Call the office right away if: You have chest pain. You feel more short of breath than you have felt before. You feel more light-headed than you have felt before. Your incision starts to open up.  This information is not intended to replace advice given to you by your health care provider. Make sure you discuss any questions you have with your health care provider.

## 2023-03-28 NOTE — Telephone Encounter (Signed)
Not sure what is the usual protocol is in this case. Dr. Jacques Navy to review. The patient underwent pacemaker implantation due to prolonged sinus pause, does the monitor company still need to send [place] the summary result?

## 2023-03-29 NOTE — Telephone Encounter (Signed)
Spoke with Idamae Schuller,  Aware once they received monitor we would like to have end of summary results.

## 2023-03-29 NOTE — Telephone Encounter (Signed)
See recommendation by MD

## 2023-04-02 MED FILL — Gentamicin Sulfate Inj 40 MG/ML: INTRAMUSCULAR | Qty: 80 | Status: AC

## 2023-04-04 ENCOUNTER — Ambulatory Visit: Payer: Medicare Other | Attending: Physician Assistant | Admitting: Physician Assistant

## 2023-04-04 ENCOUNTER — Encounter: Payer: Self-pay | Admitting: Physician Assistant

## 2023-04-04 VITALS — BP 118/72 | HR 70 | Ht 61.5 in | Wt 122.2 lb

## 2023-04-04 DIAGNOSIS — Z950 Presence of cardiac pacemaker: Secondary | ICD-10-CM | POA: Diagnosis not present

## 2023-04-04 DIAGNOSIS — I10 Essential (primary) hypertension: Secondary | ICD-10-CM | POA: Diagnosis not present

## 2023-04-04 DIAGNOSIS — E785 Hyperlipidemia, unspecified: Secondary | ICD-10-CM | POA: Diagnosis not present

## 2023-04-04 NOTE — Patient Instructions (Signed)
Medication Instructions:  Your physician recommends that you continue on your current medications as directed. Please refer to the Current Medication list given to you today.  *If you need a refill on your cardiac medications before your next appointment, please call your pharmacy*   Lab Work: None ordered  If you have labs (blood work) drawn today and your tests are completely normal, you will receive your results only by: MyChart Message (if you have MyChart) OR A paper copy in the mail If you have any lab test that is abnormal or we need to change your treatment, we will call you to review the results.   Testing/Procedures: None orderd   Follow-Up: At [facility], you and your health needs are our priority.  As part of our continuing mission to provide you with exceptional heart care, we have created designated Provider Care Teams.  These Care Teams include your primary Cardiologist (physician) and Advanced Practice Providers (APPs -  Physician Assistants and Nurse Practitioners) who all work together to provide you with the care you need, when you need it.  We recommend signing up for the patient portal called "MyChart".  Sign up information is provided on this After Visit Summary.  MyChart is used to connect with patients for Virtual Visits (Telemedicine).  Patients are able to view lab/test results, encounter notes, upcoming appointments, etc.  Non-urgent messages can be sent to your provider as well.   To learn more about what you can do with MyChart, go to [email or web address].    Your next appointment:   08/06/23 ARRIVE AT 8:45   Provider:   Parke Poisson, MD     Other Instructions

## 2023-04-04 NOTE — Progress Notes (Signed)
Cardiology Office Note:  .   Date:  04/04/2023  ID:  Kim Moreno, DOB March 28, 1946, MRN [id number] PCP: Merri Brunette, MD  [facility] Providers Cardiologist:  Parke Poisson, MD  Electrophysiologist: Dr. Ladona Ridgel     History of Present Illness: .   Kim Moreno is a 77 y.o. female with past medical history of heart murmur, hypertension, hyperlipidemia, RBBB, CHB s/p PPM and history of left breast cancer s/p radiation therapy in 2008.  She has a history of coronary artery calcification seen on the previous coronary CT.  Coronary calcium score obtained in April 2022 demonstrated coronary calcium score of 231 which placed the patient at the 21 percentile for age and sex matched control, heavily calcified mitral valve annulus.  Echocardiogram obtained on 08/22/2022 showed EF 60 to 65%, severe mitral annular calcification, mild MR, moderate LVE, normal RV size and pressure.  She was recently admitted to the [facility] on 03/20/2023 for evaluation of dizziness and chest pressure.  She had an episode where she became so weak she fell down to the ground outside of a restaurant.  On arrival, which it was noted patient was in transient 2:1 AV block.  Due to mild troponin elevation, she underwent cardiac catheterization which showed normal coronary arteries, normal LVEDP.  EP service was curb sided and she was ultimately discharged on a heart monitor.  I later sent the patient to the hospital on 03/25/2023 due to complete heart block and prolonged pauses up to 11 seconds.  She ultimately underwent implantation of Saint Jude dual-chamber pacemaker on 03/25/2023 by Dr. Ladona Ridgel.  Patient presents today for posthospital follow-up.  Since pacemaker implantation, she has not had any more dizzy spell.  She denies any significant chest discomfort.  She does complain of some shortness of breath when she walked outside in the hot weather.  Post pacemaker chest x-ray shows no sign of pneumothorax.  She has  good breath sound from the base of the lung all the way up to the apices of the lung, there is no evidence of pneumothorax on my physical exam either.  The shortness of breath only occurs when she walks outside in the hot weather and does not occur when she walks around normally.  She can follow-up with the device clinic wound check next week and follow-up with Dr. Ladona Ridgel in October.  She can follow-up with Dr. Jacques Navy in 4 to 81-month.  ROS:   She denies chest pain, palpitations, dyspnea, pnd, orthopnea, n, v, dizziness, syncope, edema, weight gain, or early satiety. All other systems reviewed and are otherwise negative except as noted above.    Studies Reviewed: .        Cardiac Studies & Procedures   CARDIAC CATHETERIZATION  CARDIAC CATHETERIZATION 03/21/2023  Narrative 1.  No significant obstructive coronary artery disease of codominant system. 2.  LVEDP of 4 mmHg.  Recommendation: Medical therapy.  Findings Coronary Findings Diagnostic  Dominance: Co-dominant  No diagnostic findings have been documented. Intervention  No interventions have been documented.     ECHOCARDIOGRAM  ECHOCARDIOGRAM COMPLETE 08/22/2022  Narrative ECHOCARDIOGRAM REPORT    Patient Name:   Kim Moreno Date of Exam: 08/22/2022 Medical Rec #:  [id number]      Height:       60.5 in Accession #:    [id number]     Weight:       125.2 lb Date of Birth:  [birth year]       BSA:  1.539 m Patient Age:    76 years       BP:           100/62 mmHg Patient Gender: F              HR:           65 bpm. Exam Location:  [address]  Procedure: 2D Echo, 3D Echo, Cardiac Doppler, Color Doppler and Strain Analysis  Indications:    R01.1 Murmur  History:        Patient has no prior history of Echocardiogram examinations. Arrythmias:RBBB; Risk Factors:Hypertension and Dyslipidemia. Breast cancer.  Sonographer:    Jorje Guild [facility], RDCS Referring Phys: [id number] Parke Poisson  IMPRESSIONS   1.  Left ventricular ejection fraction, by estimation, is 60 to 65%. The left ventricle has normal function. The left ventricle has no regional wall motion abnormalities. Left ventricular diastolic parameters are indeterminate. The average left ventricular global longitudinal strain is -25.3 %. The global longitudinal strain is normal. 2. Right ventricular systolic function is normal. The right ventricular size is normal. There is normal pulmonary artery systolic pressure. 3. Left atrial size was moderately dilated. 4. The mitral valve is degenerative. Mild mitral valve regurgitation. No evidence of mitral stenosis. Severe mitral annular calcification. 5. The aortic valve is tricuspid. There is moderate calcification of the aortic valve. There is moderate thickening of the aortic valve. Aortic valve regurgitation is not visualized. Aortic valve sclerosis is present, with no evidence of aortic valve stenosis. 6. The inferior vena cava is normal in size with greater than 50% respiratory variability, suggesting right atrial pressure of 3 mmHg.  FINDINGS Left Ventricle: Left ventricular ejection fraction, by estimation, is 60 to 65%. The left ventricle has normal function. The left ventricle has no regional wall motion abnormalities. The average left ventricular global longitudinal strain is -25.3 %. The global longitudinal strain is normal. The left ventricular internal cavity size was normal in size. There is no left ventricular hypertrophy. Left ventricular diastolic parameters are indeterminate.  Right Ventricle: The right ventricular size is normal. No increase in right ventricular wall thickness. Right ventricular systolic function is normal. There is normal pulmonary artery systolic pressure. The tricuspid regurgitant velocity is 2.57 m/s, and with an assumed right atrial pressure of 3 mmHg, the estimated right ventricular systolic pressure is 29.4 mmHg.  Left Atrium: Left atrial size was moderately  dilated.  Right Atrium: Right atrial size was normal in size.  Pericardium: There is no evidence of pericardial effusion.  Mitral Valve: The mitral valve is degenerative in appearance. There is moderate thickening of the mitral valve leaflet(s). There is moderate calcification of the mitral valve leaflet(s). Severe mitral annular calcification. Mild mitral valve regurgitation. No evidence of mitral valve stenosis. MV peak gradient, 13.4 mmHg. The mean mitral valve gradient is 4.0 mmHg.  Tricuspid Valve: The tricuspid valve is normal in structure. Tricuspid valve regurgitation is mild . No evidence of tricuspid stenosis.  Aortic Valve: The aortic valve is tricuspid. There is moderate calcification of the aortic valve. There is moderate thickening of the aortic valve. Aortic valve regurgitation is not visualized. Aortic valve sclerosis is present, with no evidence of aortic valve stenosis.  Pulmonic Valve: The pulmonic valve was normal in structure. Pulmonic valve regurgitation is mild. No evidence of pulmonic stenosis.  Aorta: The aortic root is normal in size and structure.  Venous: The inferior vena cava is normal in size with greater than 50% respiratory variability, suggesting right  atrial pressure of 3 mmHg.  IAS/Shunts: No atrial level shunt detected by color flow Doppler.   LEFT VENTRICLE PLAX 2D LVIDd:         4.10 cm   Diastology LVIDs:         2.20 cm   LV e' medial:    6.38 cm/s LV PW:         1.00 cm   LV E/e' medial:  19.7 LV IVS:        1.00 cm   LV e' lateral:   6.08 cm/s LVOT diam:     2.10 cm   LV E/e' lateral: 20.7 LV SV:         105 LV SV Index:   68        2D Longitudinal Strain LVOT Area:     3.46 cm  2D Strain GLS (A2C):   -26.8 % 2D Strain GLS (A3C):   -24.0 % 2D Strain GLS (A4C):   -25.0 % 2D Strain GLS Avg:     -25.3 %  3D Volume EF: 3D EF:        78 % LV EDV:       90 ml LV ESV:       20 ml LV SV:        70 ml  RIGHT VENTRICLE             IVC RV  Basal diam:  3.10 cm     IVC diam: 1.50 cm RV S prime:     14.50 cm/s TAPSE (M-mode): 2.0 cm RVSP:           29.4 mmHg  LEFT ATRIUM             Index        RIGHT ATRIUM           Index LA diam:        4.40 cm 2.86 cm/m   RA Pressure: 3.00 mmHg LA Vol (A2C):   72.7 ml 47.24 ml/m  RA Area:     14.60 cm LA Vol (A4C):   62.1 ml 40.35 ml/m  RA Volume:   36.20 ml  23.52 ml/m LA Biplane Vol: 68.4 ml 44.44 ml/m AORTIC VALVE LVOT Vmax:   138.00 cm/s LVOT Vmean:  89.500 cm/s LVOT VTI:    0.302 m  AORTA Ao Root diam: 3.40 cm Ao Asc diam:  3.50 cm  MITRAL VALVE                TRICUSPID VALVE TR Peak grad:   26.4 mmHg MV Area VTI:   2.18 cm     TR Vmax:        257.00 cm/s MV Peak grad:  13.4 mmHg    Estimated RAP:  3.00 mmHg MV Mean grad:  4.0 mmHg     RVSP:           29.4 mmHg MV Vmax:       1.83 m/s MV Vmean:      91.8 cm/s    SHUNTS MV Decel Time: 299 msec     Systemic VTI:  0.30 m MV E velocity: 126.00 cm/s  Systemic Diam: 2.10 cm MV A velocity: 179.00 cm/s MV E/A ratio:  0.70  Charlton Haws MD Electronically signed by Charlton Haws MD Signature Date/Time: 08/22/2022/11:17:04 AM    Final     CT SCANS  CT CARDIAC SCORING (SELF PAY ONLY) 01/04/2021  Addendum 01/04/2021  5:09 PM ADDENDUM REPORT: 01/04/2021 17:06  CLINICAL DATA:  Cardiovascular Disease Risk stratification  EXAM: Coronary Calcium Score  TECHNIQUE: A gated, non-contrast computed tomography scan of the heart was performed using 3mm slice thickness. Axial images were analyzed on a dedicated workstation. Calcium scoring of the coronary arteries was performed using the Agatston method.  FINDINGS: Coronary arteries: Normal origins.  Coronary Calcium Score:  Left main: 125  Left anterior descending artery: 71  Left circumflex artery: 0  Right coronary artery: 35  Total: 231  Percentile: 76  Pericardium: Normal.  Aorta: Normal caliber of ascending aorta. Aortic  atherosclerosis noted.  Mitral annular calcification noted.  Non-cardiac: See separate report from [facility].  IMPRESSION: Coronary calcium score of 231. This was 68 percentile for age-, race-, and sex-matched controls.  RECOMMENDATIONS: Coronary artery calcium (CAC) score is a strong predictor of incident coronary heart disease (CHD) and provides predictive information beyond traditional risk factors. CAC scoring is reasonable to use in the decision to withhold, postpone, or initiate statin therapy in intermediate-risk or selected borderline-risk asymptomatic adults (age 68-75 years and LDL-C >=70 to <190 mg/dL) who do not have diabetes or established atherosclerotic cardiovascular disease (ASCVD).* In intermediate-risk (10-year ASCVD risk >=7.5% to <20%) adults or selected borderline-risk (10-year ASCVD risk >=5% to <7.5%) adults in whom a CAC score is measured for the purpose of making a treatment decision the following recommendations have been made:  If CAC=0, it is reasonable to withhold statin therapy and reassess in 5 to 10 years, as long as higher risk conditions are absent (diabetes mellitus, family history of premature CHD in first degree relatives (males <55 years; females <65 years), cigarette smoking, or LDL >=190 mg/dL).  If CAC is 1 to 99, it is reasonable to initiate statin therapy for patients >=10 years of age.  If CAC is >=100 or >=75th percentile, it is reasonable to initiate statin therapy at any age.  Cardiology referral should be considered for patients with CAC scores >=400 or >=75th percentile.  *2018 AHA/ACC/AACVPR/AAPA/ABC/ACPM/ADA/AGS/APhA/ASPC/NLA/PCNA Guideline on the Management of Blood Cholesterol: A Report of the American College of Cardiology/American Heart Association Task Force on Clinical Practice Guidelines. J Am Coll Cardiol. 2019;73(24):3168-3209.  Jodelle Red, MD   Electronically Signed By: Jodelle Red M.D. On: 01/04/2021 17:06  Narrative EXAM: OVER-READ INTERPRETATION  CT CHEST  The following report is an over-read performed by radiologist Dr. Irish Lack of [facility] on 01/04/2021. This over-read does not include interpretation of cardiac or coronary anatomy or pathology. The coronary calcium score interpretation by the cardiologist is attached.  COMPARISON:  CT of the chest on 08/26/2017  FINDINGS: Vascular: Calcified plaque present at the level of the aortic arch and descending thoracic aorta. The mitral valve annulus is heavily calcified.  Mediastinum/Nodes: Visualized mediastinum and hilar regions demonstrate no lymphadenopathy or masses.  Lungs/Pleura: Visualized lungs show no evidence of pulmonary edema, consolidation, pneumothorax, nodule or pleural fluid.  Upper Abdomen: Stable cyst in the left lobe of the liver.  Musculoskeletal: No chest wall mass or suspicious bone lesions identified.  IMPRESSION: Aortic atherosclerosis with calcified plaque in the aortic arch and descending thoracic aorta. Heavily calcified mitral valve annulus.  Aortic Atherosclerosis (ICD10-I70.0).  Electronically Signed: By: Irish Lack M.D. On: 01/04/2021 13:25          Risk Assessment/Calculations:             Physical Exam:   VS:  BP 118/72   Pulse 70   Ht 5' 1.5" (1.562 m)   Wt  122 lb 3.2 oz (55.4 kg)   SpO2 96%   BMI 22.72 kg/m    Wt Readings from Last 3 Encounters:  04/04/23 122 lb 3.2 oz (55.4 kg)  03/20/23 120 lb 13 oz (54.8 kg)  07/24/22 125 lb 3.2 oz (56.8 kg)    GEN: Well nourished, well developed in no acute distress NECK: No JVD; No carotid bruits CARDIAC: RRR, no murmurs, rubs, gallops RESPIRATORY:  Clear to auscultation without rales, wheezing or rhonchi  ABDOMEN: Soft, non-tender, non-distended EXTREMITIES:  No edema; No deformity   ASSESSMENT AND PLAN: .    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  Pacemaker site appears to be clean, dry, no obvious bleeding.  Pending upcoming wound check and device interrogation.  Hypertension: Blood pressure stable  Hyperlipidemia: On Crestor and fish oil       Dispo: Follow-up with Dr. Ladona Ridgel and device clinic  Signed, Azalee Course, [place]

## 2023-04-05 ENCOUNTER — Telehealth: Payer: Self-pay | Admitting: Internal Medicine

## 2023-04-05 NOTE — Telephone Encounter (Signed)
Patient inquired if she needed to bring her remote monitor to DC apt.   Advised patient she does not need to bring monitor into clinic. Remote monitor is connected in [place].   Patient appreciative of explantation and thanked me for call.

## 2023-04-05 NOTE — Telephone Encounter (Signed)
Patient wants call back to discuss requirements for wound check visit on 7/18.

## 2023-04-10 ENCOUNTER — Telehealth: Payer: Self-pay | Admitting: Internal Medicine

## 2023-04-10 NOTE — Telephone Encounter (Signed)
Follow Up:     Kim Moreno from [facility] is calling with abnormal results.

## 2023-04-10 NOTE — Telephone Encounter (Signed)
Wearing monitor.  Check up for Quality measure  Symptomatic SVT as recorded  incorrect on final report.  Final report  will be amended and re sent

## 2023-04-10 NOTE — Telephone Encounter (Signed)
Printed addended results, sent to HIM to be scanned. Patient has already been treated for what was resulted from the monitor and pt's s/s.

## 2023-04-11 ENCOUNTER — Ambulatory Visit: Payer: Medicare Other | Attending: Cardiology

## 2023-04-11 DIAGNOSIS — I442 Atrioventricular block, complete: Secondary | ICD-10-CM | POA: Diagnosis not present

## 2023-04-11 LAB — CUP PACEART INCLINIC DEVICE CHECK
Battery Remaining Longevity: 93 mo
Battery Voltage: 3.1 V
Brady Statistic RA Percent Paced: 6.3 %
Brady Statistic RV Percent Paced: 49 %
Date Time Interrogation Session: 20240718131713
Implantable Lead Connection Status: 753985
Implantable Lead Connection Status: 753985
Implantable Lead Implant Date: 20240701
Implantable Lead Implant Date: 20240701
Implantable Lead Location: 753859
Implantable Lead Location: 753860
Implantable Pulse Generator Implant Date: 20240701
Lead Channel Impedance Value: 450 Ohm
Lead Channel Impedance Value: 525 Ohm
Lead Channel Pacing Threshold Amplitude: 0.75 V
Lead Channel Pacing Threshold Amplitude: 0.75 V
Lead Channel Pacing Threshold Amplitude: 1 V
Lead Channel Pacing Threshold Amplitude: 1 V
Lead Channel Pacing Threshold Pulse Width: 0.5 ms
Lead Channel Pacing Threshold Pulse Width: 0.5 ms
Lead Channel Pacing Threshold Pulse Width: 0.5 ms
Lead Channel Pacing Threshold Pulse Width: 0.5 ms
Lead Channel Sensing Intrinsic Amplitude: 5 mV
Lead Channel Sensing Intrinsic Amplitude: 8.8 mV
Lead Channel Setting Pacing Amplitude: 3.5 V
Lead Channel Setting Pacing Amplitude: 3.5 V
Lead Channel Setting Pacing Pulse Width: 0.5 ms
Lead Channel Setting Sensing Sensitivity: 2 mV
Pulse Gen Model: 2272
Pulse Gen Serial Number: 5829992

## 2023-04-11 MED ORDER — AMOXICILLIN 500 MG PO TABS: 2000.0000 mg | ORAL_TABLET | Freq: Once | ORAL | 0 refills | Status: AC

## 2023-04-11 NOTE — Patient Instructions (Addendum)
Take amoxicillin 500 mg-  4 tablets by mouth prior to your dental appointment.   After Your Pacemaker   Monitor your pacemaker site for redness, swelling, and drainage. Call the device clinic at [phone] if you experience these symptoms or fever/chills.  Your incision was closed with Steri-strips or staples:  You may shower 7 days after your procedure and wash your incision with soap and water. Avoid lotions, ointments, or perfumes over your incision until it is well-healed.  You may use a hot tub or a pool after your wound check appointment if the incision is completely closed.  Do not lift, push or pull greater than 10 pounds with the affected arm until 6 weeks after your procedure. There are no other restrictions in arm movement after your wound check appointment.  You may drive, unless driving has been restricted by your healthcare providers.  Remote monitoring is used to monitor your pacemaker from home. This monitoring is scheduled every 91 days by our office. It allows [place] to keep an eye on the functioning of your device to ensure it is working properly. You will routinely see your Electrophysiologist annually (more often if necessary).

## 2023-04-11 NOTE — Progress Notes (Addendum)
Wound check appointment. Steri-strips removed. Wound without redness or edema.  Stitch noted to midline of incision.  Evaluated with Dr. Ladona Ridgel.  Stitch clipped.  Minimal bleeding after stitch removal.  Bandaid applied.  Advised Pt to remove bandaid when she gets home.  Advised to wash well and continue to monitor.   Normal device function. Thresholds, sensing, and impedances consistent with implant measurements. Device programmed at 3.5V/auto capture programmed on for extra safety margin until 3 month visit. Histogram distribution appropriate for patient and level of activity. Brief atrial tachycardia episode noted-6 seconds. Patient educated about wound care, arm mobility, lifting restrictions. ROV in 3 months with implanting physician.  Pt advises that she has an emergent dental appointment today for a potential chipped tooth.  Per Dr. Richardine Service Pt take amoxicillin 2000 mg x1 PO today prior to dental appointment.  In future-will not need antibiotics prior to any further dental appointments.

## 2023-04-18 ENCOUNTER — Ambulatory Visit: Payer: Medicare Other | Attending: Cardiovascular Disease

## 2023-04-18 ENCOUNTER — Telehealth: Payer: Self-pay

## 2023-04-18 NOTE — Telephone Encounter (Signed)
The pt called because she has slight swelling from her device site. Pt coming in at 3 pm.

## 2023-04-18 NOTE — Progress Notes (Signed)
Patient brought in today to device clinic to re-assess device pocket site.  Patient concerned there has been some swelling to the area.  She is only on an ASA in terms of meds for bleeding risk.  Patient had dual chamber PPM on 03/25/23 with stitch clipped but otherwise normal healing at wound check on 04/11/23.    Today incision line presents with approximated wound edges, healing WNL with no signs of infection.  There is very minimal swelling at device pocket site which appears normal at this stage.  Patient states the swelling has not increased since implant and she denies any tenderness.  There is no warmth to the area or signs of infection.  Patient instructed that she could apply a cold compress to the area with barrier between skin to further help with mild puffiness; but that it will take several weeks for the wound to completely heal.  She will likely have a small "mound" there long term due to being thinned skin and natural size of the device.   Reviewed concerning signs to watch for in the event if new swelling/bleeding, discoloration or s/s of infection were to develop.  She states she has our direct device clinic number and knows to call if any concerns develop.  No further questions at this point

## 2023-04-29 ENCOUNTER — Institutional Professional Consult (permissible substitution): Payer: Medicare Other | Admitting: Cardiology

## 2023-05-22 DIAGNOSIS — H35371 Puckering of macula, right eye: Secondary | ICD-10-CM | POA: Diagnosis not present

## 2023-05-22 DIAGNOSIS — G43909 Migraine, unspecified, not intractable, without status migrainosus: Secondary | ICD-10-CM | POA: Diagnosis not present

## 2023-06-11 DIAGNOSIS — D224 Melanocytic nevi of scalp and neck: Secondary | ICD-10-CM | POA: Diagnosis not present

## 2023-06-11 DIAGNOSIS — D2272 Melanocytic nevi of left lower limb, including hip: Secondary | ICD-10-CM | POA: Diagnosis not present

## 2023-06-11 DIAGNOSIS — E7800 Pure hypercholesterolemia, unspecified: Secondary | ICD-10-CM | POA: Diagnosis not present

## 2023-06-11 DIAGNOSIS — Z23 Encounter for immunization: Secondary | ICD-10-CM | POA: Diagnosis not present

## 2023-06-11 DIAGNOSIS — I10 Essential (primary) hypertension: Secondary | ICD-10-CM | POA: Diagnosis not present

## 2023-06-11 DIAGNOSIS — I8312 Varicose veins of left lower extremity with inflammation: Secondary | ICD-10-CM | POA: Diagnosis not present

## 2023-06-11 DIAGNOSIS — L821 Other seborrheic keratosis: Secondary | ICD-10-CM | POA: Diagnosis not present

## 2023-06-11 DIAGNOSIS — R7303 Prediabetes: Secondary | ICD-10-CM | POA: Diagnosis not present

## 2023-06-11 DIAGNOSIS — I872 Venous insufficiency (chronic) (peripheral): Secondary | ICD-10-CM | POA: Diagnosis not present

## 2023-06-11 DIAGNOSIS — I8311 Varicose veins of right lower extremity with inflammation: Secondary | ICD-10-CM | POA: Diagnosis not present

## 2023-06-11 DIAGNOSIS — Z85828 Personal history of other malignant neoplasm of skin: Secondary | ICD-10-CM | POA: Diagnosis not present

## 2023-06-11 DIAGNOSIS — D225 Melanocytic nevi of trunk: Secondary | ICD-10-CM | POA: Diagnosis not present

## 2023-06-11 DIAGNOSIS — L570 Actinic keratosis: Secondary | ICD-10-CM | POA: Diagnosis not present

## 2023-06-26 ENCOUNTER — Ambulatory Visit (INDEPENDENT_AMBULATORY_CARE_PROVIDER_SITE_OTHER): Payer: Medicare Other

## 2023-06-26 DIAGNOSIS — I442 Atrioventricular block, complete: Secondary | ICD-10-CM | POA: Diagnosis not present

## 2023-06-26 LAB — CUP PACEART REMOTE DEVICE CHECK
Battery Remaining Longevity: 89 mo
Battery Remaining Percentage: 95.5 %
Battery Voltage: 3.01 V
Brady Statistic AP VP Percent: 1.2 %
Brady Statistic AP VS Percent: 4.4 %
Brady Statistic AS VP Percent: 22 %
Brady Statistic AS VS Percent: 72 %
Brady Statistic RA Percent Paced: 5.5 %
Brady Statistic RV Percent Paced: 24 %
Date Time Interrogation Session: 20241002042701
Implantable Lead Connection Status: 753985
Implantable Lead Connection Status: 753985
Implantable Lead Implant Date: 20240701
Implantable Lead Implant Date: 20240701
Implantable Lead Location: 753859
Implantable Lead Location: 753860
Implantable Pulse Generator Implant Date: 20240701
Lead Channel Impedance Value: 490 Ohm
Lead Channel Impedance Value: 530 Ohm
Lead Channel Pacing Threshold Amplitude: 0.75 V
Lead Channel Pacing Threshold Amplitude: 1 V
Lead Channel Pacing Threshold Pulse Width: 0.5 ms
Lead Channel Pacing Threshold Pulse Width: 0.5 ms
Lead Channel Sensing Intrinsic Amplitude: 10 mV
Lead Channel Sensing Intrinsic Amplitude: 4.7 mV
Lead Channel Setting Pacing Amplitude: 3.5 V
Lead Channel Setting Pacing Amplitude: 3.5 V
Lead Channel Setting Pacing Pulse Width: 0.5 ms
Lead Channel Setting Sensing Sensitivity: 2 mV
Pulse Gen Model: 2272
Pulse Gen Serial Number: 5829992

## 2023-07-12 NOTE — Progress Notes (Signed)
Remote pacemaker transmission.   

## 2023-07-16 ENCOUNTER — Ambulatory Visit: Payer: Medicare Other | Attending: Internal Medicine | Admitting: Internal Medicine

## 2023-07-16 ENCOUNTER — Encounter: Payer: Self-pay | Admitting: Internal Medicine

## 2023-07-16 VITALS — BP 120/62 | HR 68 | Ht 61.5 in | Wt 124.6 lb

## 2023-07-16 DIAGNOSIS — I442 Atrioventricular block, complete: Secondary | ICD-10-CM | POA: Insufficient documentation

## 2023-07-16 LAB — CUP PACEART INCLINIC DEVICE CHECK
Battery Remaining Longevity: 127 mo
Battery Voltage: 2.99 V
Brady Statistic RA Percent Paced: 5.3 %
Brady Statistic RV Percent Paced: 35 %
Date Time Interrogation Session: 20241022141833
Implantable Lead Connection Status: 753985
Implantable Lead Connection Status: 753985
Implantable Lead Implant Date: 20240701
Implantable Lead Implant Date: 20240701
Implantable Lead Location: 753859
Implantable Lead Location: 753860
Implantable Pulse Generator Implant Date: 20240701
Lead Channel Impedance Value: 462.5 Ohm
Lead Channel Impedance Value: 512.5 Ohm
Lead Channel Pacing Threshold Amplitude: 0.5 V
Lead Channel Pacing Threshold Amplitude: 0.5 V
Lead Channel Pacing Threshold Amplitude: 1 V
Lead Channel Pacing Threshold Amplitude: 1 V
Lead Channel Pacing Threshold Pulse Width: 0.5 ms
Lead Channel Pacing Threshold Pulse Width: 0.5 ms
Lead Channel Pacing Threshold Pulse Width: 0.5 ms
Lead Channel Pacing Threshold Pulse Width: 0.5 ms
Lead Channel Sensing Intrinsic Amplitude: 3.5 mV
Lead Channel Setting Pacing Amplitude: 2 V
Lead Channel Setting Pacing Amplitude: 2.5 V
Lead Channel Setting Pacing Pulse Width: 0.5 ms
Lead Channel Setting Sensing Sensitivity: 2 mV
Pulse Gen Model: 2272
Pulse Gen Serial Number: 5829992

## 2023-07-16 NOTE — Progress Notes (Signed)
HPI Kim Moreno returns today for ongoing evaluation and management of her high grade heart block. She is a pleasant 77 yo woman with HTN, and remote breast CA s/p XRT who has known conduction system disease and presented with dizziness and 2:1 AV block and underwent St. Jude DDD PPM insertion in July. She also had a documented 11 second pause. In the interim she notes she has felt well with no syncope. Allergies  Allergen Reactions   Arimidex [Anastrozole] Rash   Latex Rash     Current Outpatient Medications  Medication Sig Dispense Refill   Alpha-Lipoic Acid 100 MG TABS Take 100 mg by mouth daily. Pt takes 1/2 tab     amLODipine (NORVASC) 10 MG tablet Take 1 tablet (10 mg total) by mouth daily. 30 tablet 3   aspirin 81 MG chewable tablet Chew 81 mg by mouth daily.     Beta Carotene (VITAMIN A) 25000 UNIT capsule Take 7,000 Units by mouth daily.     Biotin 1000 MCG tablet 1 tablet Orally Once a day     Calcium-Phosphorus-Vitamin D (CITRACAL +D3) 250-107-500 MG-MG-UNIT CHEW as directed Orally     Co-Enzyme Q-10 100 MG CAPS Take 100 mg by mouth daily. Pt takes 1/2 tab     Copper Gluconate (COPPER CAPS PO) Take 5 mg by mouth daily. Pt takes 1/2 tab     Docosahexaenoic Acid (DHA PO) Take 300 mg by mouth daily. Pt takes 1/2 tab     ergocalciferol (DRISDOL) 200 MCG/ML drops Take 4,000 Units by mouth daily.     IRON-VITAMINS PO Take 6 mg by mouth.     LUTEIN PO Take 3 mg by mouth daily. Pt takes 1/2 tab     LYCOPENE PO Take 1 mg by mouth daily. Pt takes 1/2 tab     MAGNESIUM PO Take by mouth. Pt takes 1/2 tablet     Milk Thistle-Turmeric (SILYMARIN PO) Take 100 mg by mouth daily. Pt takes 1/2 tab     Misc Natural Products (GREEN FOOD COMPLEX PO) Take 2 capsules by mouth daily.     Multiple Minerals-Vitamins (CITRACAL MAXIMUM PLUS PO)      Multiple Vitamins-Minerals (ZINC PO) Take 15 mg by mouth daily. Pt takes 1/2 tab     NIACIN PO Take 20 mg by mouth daily. Pt takes 1/2 tab      olmesartan (BENICAR) 20 MG tablet Take 2 tablets (40 mg total) by mouth daily.     omega-3 acid ethyl esters (LOVAZA) 1 g capsule Take 2 g by mouth daily.     Omega-3 Fatty Acids (EPA PO) Take by mouth.     OVER THE COUNTER MEDICATION biotin     PANTOTHENIC ACID PO Take 10 mg by mouth. Pt take 1/2 tab     Probiotic Product (PROBIOTIC PO) Take by mouth daily.     Quercetin 50 MG TABS Take 50 mg by mouth daily. Pt takes 1/2 tab     RESVERATROL PO Take 50 mg by mouth daily. Pt takes 1/2 tab     rosuvastatin (CRESTOR) 10 MG tablet Take 1 tablet (10 mg total) by mouth daily. 90 tablet 1   SELENIUM PO Take 70 mcg by mouth daily. Pt takes 1/2 tab     Thiamine HCl (THIAMINE PO) Take 3 mg by mouth daily. Pt takes 1/2 tab     No current facility-administered medications for this visit.     Past Medical History:  Diagnosis Date  Breast cancer [facility]) 2007   Personal history of radiation therapy 2008   left breast    ROS:   All systems reviewed and negative except as noted in the HPI.   Past Surgical History:  Procedure Laterality Date   BREAST LUMPECTOMY Left 2007   LEFT HEART CATH AND CORONARY ANGIOGRAPHY N/A 03/21/2023   Procedure: LEFT HEART CATH AND CORONARY ANGIOGRAPHY;  Surgeon: Orbie Pyo, MD;  Location: [facility] INVASIVE CV LAB;  Service: Cardiovascular;  Laterality: N/A;   PACEMAKER IMPLANT N/A 03/25/2023   Procedure: PACEMAKER IMPLANT;  Surgeon: Marinus Maw, MD;  Location: [facility] INVASIVE CV LAB;  Service: Cardiovascular;  Laterality: N/A;   PARATHYROIDECTOMY     2000 approx     Family History  Problem Relation Age of Onset   Heart attack Brother      Social History   Socioeconomic History   Marital status: Divorced    Spouse name: Not on file   Number of children: Not on file   Years of education: Not on file   Highest education level: Not on file  Occupational History   Not on file  Tobacco Use   Smoking status: Former    Types: Cigarettes   Smokeless tobacco:  Never  Substance and Sexual Activity   Alcohol use: Not on file   Drug use: Not on file   Sexual activity: Not on file  Other Topics Concern   Not on file  Social History Narrative   Not on file   Social Determinants of Health   Financial Resource Strain: Not on file  Food Insecurity: Not on file  Transportation Needs: Not on file  Physical Activity: Not on file  Stress: Not on file  Social Connections: Not on file  Intimate Partner Violence: Not on file     BP 120/62   Pulse 68   Ht 5' 1.5" (1.562 m)   Wt 124 lb 9.6 oz (56.5 kg)   SpO2 95%   BMI 23.16 kg/m   Physical Exam:  Well appearing NAD HEENT: Unremarkable Neck:  No JVD, no thyromegally Lymphatics:  No adenopathy Back:  No CVA tenderness Lungs:  Clear with no wheezes HEART:  Regular rate rhythm, no murmurs, no rubs, no clicks Abd:  soft, positive bowel sounds, no organomegally, no rebound, no guarding Ext:  2 plus pulses, no edema, no cyanosis, no clubbing Skin:  No rashes no nodules Neuro:  CN II through XII intact, motor grossly intact  DEVICE  Normal device function.  See PaceArt for details.   Assess/Plan: CHB - she is asymptomatic s/p DDD PM insertion. PPM - Her St. Jude DDD PM is working normally. She is pacing less than 1% of the time.  Kim Gowda Yudith Norlander,MD HTN

## 2023-07-16 NOTE — Patient Instructions (Signed)

## 2023-08-06 ENCOUNTER — Encounter: Payer: Self-pay | Admitting: Internal Medicine

## 2023-08-06 ENCOUNTER — Ambulatory Visit: Payer: Medicare Other | Attending: Internal Medicine | Admitting: Internal Medicine

## 2023-08-06 VITALS — BP 118/66 | HR 69 | Ht 61.0 in | Wt 126.6 lb

## 2023-08-06 DIAGNOSIS — I10 Essential (primary) hypertension: Secondary | ICD-10-CM | POA: Diagnosis not present

## 2023-08-06 DIAGNOSIS — Z950 Presence of cardiac pacemaker: Secondary | ICD-10-CM | POA: Diagnosis not present

## 2023-08-06 DIAGNOSIS — R011 Cardiac murmur, unspecified: Secondary | ICD-10-CM | POA: Insufficient documentation

## 2023-08-06 DIAGNOSIS — I442 Atrioventricular block, complete: Secondary | ICD-10-CM | POA: Diagnosis not present

## 2023-08-06 NOTE — Patient Instructions (Signed)
Medication Instructions:  Your physician recommends that you continue on your current medications as directed. Please refer to the Current Medication list given to you today.  *If you need a refill on your cardiac medications before your next appointment, please call your pharmacy*   Lab Work: None  Testing/Procedures: Your physician has requested that you have an echocardiogram in one year prior to seeing Dr. Jacques Navy. Echocardiography is a painless test that uses sound waves to create images of your heart. It provides your doctor with information about the size and shape of your heart and how well your heart's chambers and valves are working. This procedure takes approximately one hour. There are no restrictions for this procedure. Please do NOT wear cologne, perfume, aftershave, or lotions (deodorant is allowed). Please arrive 15 minutes prior to your appointment time. This will take place at [address]    Follow-Up: At [facility], you and your health needs are our priority.  As part of our continuing mission to provide you with exceptional heart care, we have created designated Provider Care Teams.  These Care Teams include your primary Cardiologist (physician) and Advanced Practice Providers (APPs -  Physician Assistants and Nurse Practitioners) who all work together to provide you with the care you need, when you need it.   Your next appointment:   12 month(s)  Provider:   Parke Poisson, MD

## 2023-08-06 NOTE — Progress Notes (Signed)
Cardiology Office Note:  .   Date:  08/06/2023  ID:  Kim Moreno, DOB 1946/05/05, MRN [id number] PCP: Merri Brunette, MD  [facility] Providers Cardiologist:  Parke Poisson, MD    History of Present Illness: .   Kim Moreno is a 77 y.o. female.  Discussed the use of AI scribe software for clinical note transcription with the patient, who gave verbal consent to proceed.  History of Present Illness   The patient, with a history of hypertension, breast cancer, and recent pacemaker implantation, presents for a follow-up visit. She reports some new symptoms since the pacemaker implantation, including swelling in her toes and tingling in her fingers, particularly upon waking. She also notes increased shortness of breath, particularly when ascending stairs. The patient also mentions a history of falls, which she initially attributed to sleepwalking, but now believes may have been due to her AV block. The patient's brother, who encouraged her to seek medical attention, is also my patient.        ROS: negative except per HPI above.  Studies Reviewed: Marland Kitchen   EKG Interpretation Date/Time:  Tuesday August 06 2023 09:01:56 EST Ventricular Rate:  69 PR Interval:  234 QRS Duration:  122 QT Interval:  434 QTC Calculation: 465 R Axis:   -88  Text Interpretation: Atrial-sensed ventricular-paced rhythm with prolonged AV conduction Confirmed by Weston Brass ([id number]) on 08/06/2023 9:23:02 AM    Results   LABS Cholesterol: 71 (02/2023)  DIAGNOSTIC Cardiac catheterization: No obstructive CAD (02/2023) Zio monitor: 11-second asystolic episode (03/24/2023) Echocardiogram: no myocardial thickening (2023) EKG: Atrial sensed, ventricular paced (07/30/2023)     Risk Assessment/Calculations:             Physical Exam:   VS:  BP 118/66 (BP Location: Left Arm, Patient Position: Sitting, Cuff Size: Normal)   Pulse 69   Ht 5\' 1"  (1.549 m)   Wt 126 lb 9.6 oz (57.4 kg)   SpO2 97%    BMI 23.92 kg/m    Wt Readings from Last 3 Encounters:  08/06/23 126 lb 9.6 oz (57.4 kg)  07/16/23 124 lb 9.6 oz (56.5 kg)  04/04/23 122 lb 3.2 oz (55.4 kg)     Physical Exam   CARDIOVASCULAR: 1-2/6 RUSB murmur SKIN: Incision site at the pacemaker site healed well, with no signs of infection or adverse healing.     GEN: Well nourished, well developed in no acute distress NECK: No JVD; No carotid bruits RESPIRATORY:  Clear to auscultation without rales, wheezing or rhonchi  ABDOMEN: Soft, non-tender, non-distended EXTREMITIES:  No edema; No deformity   ASSESSMENT AND PLAN: .    1. Primary hypertension   2. Murmur, cardiac   3. Heart block AV complete (HCC)   4. Complete heart block (HCC)   5. Pacemaker     Assessment and Plan    Asystolic Episode and Pacemaker Placement History of an 11-second asystolic episode in June 2024, followed by multiple long pauses. Pacemaker placed in July 2024. No falls since pacemaker placement. Noted mild swelling in toes and tingling in fingers upon waking, likely unrelated to pacemaker. Pacemaker functioning normally.  Shortness of Breath New onset since pacemaker placement, particularly with exertion such as climbing stairs. No signif CAD noted on previous heart catheterization. Echocardiogram last year was normal. -Encourage gradual increase in activity level. -Consider repeat echocardiogram if shortness of breath worsens or does not improve with increased activity over the next 6-12 months.  Mitral Valve Calcification Known history  of calcification on mitral valve, possibly related to radiation therapy for breast cancer. No significant changes noted on last echocardiogram. -Plan to repeat echocardiogram prior to next visit to monitor for any changes.  General Health Maintenance Blood pressure, cholesterol, blood counts, and thyroid function all within normal limits. No signs of diabetes. -Continue current medications and lifestyle  modifications. -Return for follow-up visit in 1 year, with echocardiogram to be performed prior to visit.                Total time of encounter: 25 minutes total time of encounter, including 15 minutes spent in face-to-face patient care on the date of this encounter. This time includes coordination of care and counseling regarding above mentioned problem list. Remainder of non-face-to-face time involved reviewing chart documents/testing relevant to the patient encounter and documentation in the medical record. I have independently reviewed documentation from referring provider.   Weston Brass, MD, [facility]   [facility]

## 2023-09-07 ENCOUNTER — Other Ambulatory Visit: Payer: Self-pay | Admitting: Internal Medicine

## 2023-09-09 ENCOUNTER — Telehealth: Payer: Self-pay | Admitting: Internal Medicine

## 2023-09-09 NOTE — Telephone Encounter (Signed)
Spoke with pt. Told her I saw no reason for antibiotic but I would contact her if Dr Ladona Ridgel felt it necessary. Cleaning is for 09/19/23.

## 2023-09-09 NOTE — Telephone Encounter (Signed)
  Pt c/o medication issue:  1. Name of Medication: antibiotics   2. How are you currently taking this medication (dosage and times per day)?   3. Are you having a reaction (difficulty breathing--STAT)? No   4. What is your medication issue?  Pt said, she is getting a routine dental cleaning, since she got a PPM recently she wanted to check in with Dr. Ladona Ridgel if she need to take antibiotics prior her dental cleaning

## 2023-09-11 ENCOUNTER — Other Ambulatory Visit: Payer: Self-pay

## 2023-09-11 MED ORDER — ROSUVASTATIN CALCIUM 10 MG PO TABS: 10.0000 mg | ORAL_TABLET | Freq: Every day | ORAL | 3 refills | Status: DC

## 2023-09-13 DIAGNOSIS — Z1231 Encounter for screening mammogram for malignant neoplasm of breast: Secondary | ICD-10-CM | POA: Diagnosis not present

## 2023-09-24 ENCOUNTER — Ambulatory Visit (INDEPENDENT_AMBULATORY_CARE_PROVIDER_SITE_OTHER): Payer: Medicare Other

## 2023-09-24 DIAGNOSIS — I442 Atrioventricular block, complete: Secondary | ICD-10-CM

## 2023-09-25 LAB — CUP PACEART REMOTE DEVICE CHECK
Battery Remaining Longevity: 104 mo
Battery Remaining Percentage: 95.5 %
Battery Voltage: 3.01 V
Brady Statistic AP VP Percent: 8.7 %
Brady Statistic AP VS Percent: 1 %
Brady Statistic AS VP Percent: 91 %
Brady Statistic AS VS Percent: 1 %
Brady Statistic RA Percent Paced: 8.7 %
Brady Statistic RV Percent Paced: 99 %
Date Time Interrogation Session: 20241231021037
Implantable Lead Connection Status: 753985
Implantable Lead Connection Status: 753985
Implantable Lead Implant Date: 20240701
Implantable Lead Implant Date: 20240701
Implantable Lead Location: 753859
Implantable Lead Location: 753860
Implantable Pulse Generator Implant Date: 20240701
Lead Channel Impedance Value: 510 Ohm
Lead Channel Impedance Value: 530 Ohm
Lead Channel Pacing Threshold Amplitude: 0.5 V
Lead Channel Pacing Threshold Amplitude: 1 V
Lead Channel Pacing Threshold Pulse Width: 0.5 ms
Lead Channel Pacing Threshold Pulse Width: 0.5 ms
Lead Channel Sensing Intrinsic Amplitude: 11.5 mV
Lead Channel Sensing Intrinsic Amplitude: 4.8 mV
Lead Channel Setting Pacing Amplitude: 2 V
Lead Channel Setting Pacing Amplitude: 2.5 V
Lead Channel Setting Pacing Pulse Width: 0.5 ms
Lead Channel Setting Sensing Sensitivity: 2 mV
Pulse Gen Model: 2272
Pulse Gen Serial Number: 5829992

## 2023-10-21 DIAGNOSIS — Z85828 Personal history of other malignant neoplasm of skin: Secondary | ICD-10-CM | POA: Diagnosis not present

## 2023-10-21 DIAGNOSIS — L309 Dermatitis, unspecified: Secondary | ICD-10-CM | POA: Diagnosis not present

## 2023-10-21 DIAGNOSIS — I8311 Varicose veins of right lower extremity with inflammation: Secondary | ICD-10-CM | POA: Diagnosis not present

## 2023-10-21 DIAGNOSIS — I872 Venous insufficiency (chronic) (peripheral): Secondary | ICD-10-CM | POA: Diagnosis not present

## 2023-10-21 DIAGNOSIS — L723 Sebaceous cyst: Secondary | ICD-10-CM | POA: Diagnosis not present

## 2023-10-21 DIAGNOSIS — I8312 Varicose veins of left lower extremity with inflammation: Secondary | ICD-10-CM | POA: Diagnosis not present

## 2023-11-05 NOTE — Addendum Note (Signed)
Addended by: Geralyn Flash D on: 11/05/2023 04:16 PM   Modules accepted: Orders

## 2023-11-05 NOTE — Progress Notes (Signed)
Remote pacemaker transmission.

## 2023-12-09 DIAGNOSIS — H52203 Unspecified astigmatism, bilateral: Secondary | ICD-10-CM | POA: Diagnosis not present

## 2023-12-09 DIAGNOSIS — Z961 Presence of intraocular lens: Secondary | ICD-10-CM | POA: Diagnosis not present

## 2023-12-18 DIAGNOSIS — Z0000 Encounter for general adult medical examination without abnormal findings: Secondary | ICD-10-CM | POA: Diagnosis not present

## 2023-12-18 DIAGNOSIS — R7303 Prediabetes: Secondary | ICD-10-CM | POA: Diagnosis not present

## 2023-12-18 DIAGNOSIS — Z1211 Encounter for screening for malignant neoplasm of colon: Secondary | ICD-10-CM | POA: Diagnosis not present

## 2023-12-18 DIAGNOSIS — K219 Gastro-esophageal reflux disease without esophagitis: Secondary | ICD-10-CM | POA: Diagnosis not present

## 2023-12-18 DIAGNOSIS — Z950 Presence of cardiac pacemaker: Secondary | ICD-10-CM | POA: Diagnosis not present

## 2023-12-18 DIAGNOSIS — Z23 Encounter for immunization: Secondary | ICD-10-CM | POA: Diagnosis not present

## 2023-12-18 DIAGNOSIS — Z853 Personal history of malignant neoplasm of breast: Secondary | ICD-10-CM | POA: Diagnosis not present

## 2023-12-18 DIAGNOSIS — M810 Age-related osteoporosis without current pathological fracture: Secondary | ICD-10-CM | POA: Diagnosis not present

## 2023-12-18 DIAGNOSIS — I10 Essential (primary) hypertension: Secondary | ICD-10-CM | POA: Diagnosis not present

## 2023-12-18 DIAGNOSIS — E7800 Pure hypercholesterolemia, unspecified: Secondary | ICD-10-CM | POA: Diagnosis not present

## 2023-12-18 DIAGNOSIS — Z1331 Encounter for screening for depression: Secondary | ICD-10-CM | POA: Diagnosis not present

## 2023-12-24 ENCOUNTER — Ambulatory Visit (INDEPENDENT_AMBULATORY_CARE_PROVIDER_SITE_OTHER): Payer: Medicare Other

## 2023-12-24 DIAGNOSIS — I442 Atrioventricular block, complete: Secondary | ICD-10-CM

## 2023-12-24 LAB — CUP PACEART REMOTE DEVICE CHECK
Battery Remaining Longevity: 98 mo
Battery Remaining Percentage: 95.5 %
Battery Voltage: 2.99 V
Brady Statistic AP VP Percent: 10 %
Brady Statistic AP VS Percent: 1 %
Brady Statistic AS VP Percent: 90 %
Brady Statistic AS VS Percent: 1 %
Brady Statistic RA Percent Paced: 10 %
Brady Statistic RV Percent Paced: 99 %
Date Time Interrogation Session: 20250401020024
Implantable Lead Connection Status: 753985
Implantable Lead Connection Status: 753985
Implantable Lead Implant Date: 20240701
Implantable Lead Implant Date: 20240701
Implantable Lead Location: 753859
Implantable Lead Location: 753860
Implantable Pulse Generator Implant Date: 20240701
Lead Channel Impedance Value: 450 Ohm
Lead Channel Impedance Value: 480 Ohm
Lead Channel Pacing Threshold Amplitude: 0.5 V
Lead Channel Pacing Threshold Amplitude: 1 V
Lead Channel Pacing Threshold Pulse Width: 0.5 ms
Lead Channel Pacing Threshold Pulse Width: 0.5 ms
Lead Channel Sensing Intrinsic Amplitude: 11.8 mV
Lead Channel Sensing Intrinsic Amplitude: 5 mV
Lead Channel Setting Pacing Amplitude: 2 V
Lead Channel Setting Pacing Amplitude: 2.5 V
Lead Channel Setting Pacing Pulse Width: 0.5 ms
Lead Channel Setting Sensing Sensitivity: 2 mV
Pulse Gen Model: 2272
Pulse Gen Serial Number: 5829992

## 2023-12-29 ENCOUNTER — Encounter: Payer: Self-pay | Admitting: Internal Medicine

## 2024-01-01 ENCOUNTER — Encounter: Payer: Self-pay | Admitting: Internal Medicine

## 2024-01-13 DIAGNOSIS — M546 Pain in thoracic spine: Secondary | ICD-10-CM | POA: Diagnosis not present

## 2024-02-06 NOTE — Progress Notes (Signed)
 Remote pacemaker transmission.

## 2024-03-24 ENCOUNTER — Ambulatory Visit (INDEPENDENT_AMBULATORY_CARE_PROVIDER_SITE_OTHER): Payer: Medicare Other

## 2024-03-24 DIAGNOSIS — I442 Atrioventricular block, complete: Secondary | ICD-10-CM | POA: Diagnosis not present

## 2024-03-24 LAB — CUP PACEART REMOTE DEVICE CHECK
Battery Remaining Longevity: 96 mo
Battery Remaining Percentage: 93 %
Battery Voltage: 2.99 V
Brady Statistic AP VP Percent: 10 %
Brady Statistic AP VS Percent: 1 %
Brady Statistic AS VP Percent: 90 %
Brady Statistic AS VS Percent: 1 %
Brady Statistic RA Percent Paced: 10 %
Brady Statistic RV Percent Paced: 99 %
Date Time Interrogation Session: 20250701022501
Implantable Lead Connection Status: 753985
Implantable Lead Connection Status: 753985
Implantable Lead Implant Date: 20240701
Implantable Lead Implant Date: 20240701
Implantable Lead Location: 753859
Implantable Lead Location: 753860
Implantable Pulse Generator Implant Date: 20240701
Lead Channel Impedance Value: 430 Ohm
Lead Channel Impedance Value: 510 Ohm
Lead Channel Pacing Threshold Amplitude: 0.5 V
Lead Channel Pacing Threshold Amplitude: 1 V
Lead Channel Pacing Threshold Pulse Width: 0.5 ms
Lead Channel Pacing Threshold Pulse Width: 0.5 ms
Lead Channel Sensing Intrinsic Amplitude: 11.8 mV
Lead Channel Sensing Intrinsic Amplitude: 3.6 mV
Lead Channel Setting Pacing Amplitude: 2 V
Lead Channel Setting Pacing Amplitude: 2.5 V
Lead Channel Setting Pacing Pulse Width: 0.5 ms
Lead Channel Setting Sensing Sensitivity: 2 mV
Pulse Gen Model: 2272
Pulse Gen Serial Number: 5829992

## 2024-03-25 ENCOUNTER — Ambulatory Visit: Payer: Self-pay | Admitting: Internal Medicine

## 2024-03-25 DIAGNOSIS — W57XXXA Bitten or stung by nonvenomous insect and other nonvenomous arthropods, initial encounter: Secondary | ICD-10-CM | POA: Diagnosis not present

## 2024-03-25 DIAGNOSIS — S60462A Insect bite (nonvenomous) of right middle finger, initial encounter: Secondary | ICD-10-CM | POA: Diagnosis not present

## 2024-04-06 DIAGNOSIS — E7800 Pure hypercholesterolemia, unspecified: Secondary | ICD-10-CM | POA: Diagnosis not present

## 2024-04-06 DIAGNOSIS — R21 Rash and other nonspecific skin eruption: Secondary | ICD-10-CM | POA: Diagnosis not present

## 2024-04-06 DIAGNOSIS — I10 Essential (primary) hypertension: Secondary | ICD-10-CM | POA: Diagnosis not present

## 2024-04-06 DIAGNOSIS — Z950 Presence of cardiac pacemaker: Secondary | ICD-10-CM | POA: Diagnosis not present

## 2024-04-06 DIAGNOSIS — R600 Localized edema: Secondary | ICD-10-CM | POA: Diagnosis not present

## 2024-06-17 DIAGNOSIS — D1801 Hemangioma of skin and subcutaneous tissue: Secondary | ICD-10-CM | POA: Diagnosis not present

## 2024-06-17 DIAGNOSIS — D2272 Melanocytic nevi of left lower limb, including hip: Secondary | ICD-10-CM | POA: Diagnosis not present

## 2024-06-17 DIAGNOSIS — L570 Actinic keratosis: Secondary | ICD-10-CM | POA: Diagnosis not present

## 2024-06-17 DIAGNOSIS — L720 Epidermal cyst: Secondary | ICD-10-CM | POA: Diagnosis not present

## 2024-06-17 DIAGNOSIS — L821 Other seborrheic keratosis: Secondary | ICD-10-CM | POA: Diagnosis not present

## 2024-06-17 DIAGNOSIS — D225 Melanocytic nevi of trunk: Secondary | ICD-10-CM | POA: Diagnosis not present

## 2024-06-17 DIAGNOSIS — Z85828 Personal history of other malignant neoplasm of skin: Secondary | ICD-10-CM | POA: Diagnosis not present

## 2024-06-17 DIAGNOSIS — C44319 Basal cell carcinoma of skin of other parts of face: Secondary | ICD-10-CM | POA: Diagnosis not present

## 2024-06-17 DIAGNOSIS — C44311 Basal cell carcinoma of skin of nose: Secondary | ICD-10-CM | POA: Diagnosis not present

## 2024-06-18 DIAGNOSIS — I10 Essential (primary) hypertension: Secondary | ICD-10-CM | POA: Diagnosis not present

## 2024-06-18 DIAGNOSIS — E7800 Pure hypercholesterolemia, unspecified: Secondary | ICD-10-CM | POA: Diagnosis not present

## 2024-06-18 DIAGNOSIS — Z23 Encounter for immunization: Secondary | ICD-10-CM | POA: Diagnosis not present

## 2024-06-18 DIAGNOSIS — Z950 Presence of cardiac pacemaker: Secondary | ICD-10-CM | POA: Diagnosis not present

## 2024-06-23 ENCOUNTER — Ambulatory Visit: Payer: Medicare Other

## 2024-06-23 DIAGNOSIS — I442 Atrioventricular block, complete: Secondary | ICD-10-CM | POA: Diagnosis not present

## 2024-06-25 LAB — CUP PACEART REMOTE DEVICE CHECK
Battery Remaining Longevity: 92 mo
Battery Remaining Percentage: 90 %
Battery Voltage: 2.99 V
Brady Statistic AP VP Percent: 11 %
Brady Statistic AP VS Percent: 1 %
Brady Statistic AS VP Percent: 89 %
Brady Statistic AS VS Percent: 1 %
Brady Statistic RA Percent Paced: 11 %
Brady Statistic RV Percent Paced: 99 %
Date Time Interrogation Session: 20250930095206
Implantable Lead Connection Status: 753985
Implantable Lead Connection Status: 753985
Implantable Lead Implant Date: 20240701
Implantable Lead Implant Date: 20240701
Implantable Lead Location: 753859
Implantable Lead Location: 753860
Implantable Pulse Generator Implant Date: 20240701
Lead Channel Impedance Value: 430 Ohm
Lead Channel Impedance Value: 460 Ohm
Lead Channel Pacing Threshold Amplitude: 0.5 V
Lead Channel Pacing Threshold Amplitude: 1 V
Lead Channel Pacing Threshold Pulse Width: 0.5 ms
Lead Channel Pacing Threshold Pulse Width: 0.5 ms
Lead Channel Sensing Intrinsic Amplitude: 11.9 mV
Lead Channel Sensing Intrinsic Amplitude: 5 mV
Lead Channel Setting Pacing Amplitude: 2 V
Lead Channel Setting Pacing Amplitude: 2.5 V
Lead Channel Setting Pacing Pulse Width: 0.5 ms
Lead Channel Setting Sensing Sensitivity: 2 mV
Pulse Gen Model: 2272
Pulse Gen Serial Number: 5829992

## 2024-06-25 NOTE — Progress Notes (Signed)
 Remote PPM Transmission

## 2024-06-28 ENCOUNTER — Ambulatory Visit: Payer: Self-pay | Admitting: Internal Medicine

## 2024-07-02 NOTE — Progress Notes (Signed)
 Remote PPM Transmission

## 2024-07-17 ENCOUNTER — Ambulatory Visit: Attending: Internal Medicine | Admitting: Internal Medicine

## 2024-07-17 ENCOUNTER — Encounter: Payer: Self-pay | Admitting: Internal Medicine

## 2024-07-17 VITALS — BP 106/58 | HR 68 | Ht 61.0 in | Wt 127.2 lb

## 2024-07-17 DIAGNOSIS — I442 Atrioventricular block, complete: Secondary | ICD-10-CM | POA: Insufficient documentation

## 2024-07-17 DIAGNOSIS — I10 Essential (primary) hypertension: Secondary | ICD-10-CM | POA: Insufficient documentation

## 2024-07-17 LAB — CUP PACEART INCLINIC DEVICE CHECK
Battery Remaining Longevity: 93 mo
Battery Voltage: 2.99 V
Brady Statistic RA Percent Paced: 11 %
Brady Statistic RV Percent Paced: 99.9 %
Date Time Interrogation Session: 20251024163454
Implantable Lead Connection Status: 753985
Implantable Lead Connection Status: 753985
Implantable Lead Implant Date: 20240701
Implantable Lead Implant Date: 20240701
Implantable Lead Location: 753859
Implantable Lead Location: 753860
Implantable Pulse Generator Implant Date: 20240701
Lead Channel Impedance Value: 425 Ohm
Lead Channel Impedance Value: 487.5 Ohm
Lead Channel Pacing Threshold Amplitude: 0.75 V
Lead Channel Pacing Threshold Amplitude: 0.75 V
Lead Channel Pacing Threshold Amplitude: 0.75 V
Lead Channel Pacing Threshold Amplitude: 0.75 V
Lead Channel Pacing Threshold Pulse Width: 0.5 ms
Lead Channel Pacing Threshold Pulse Width: 0.5 ms
Lead Channel Pacing Threshold Pulse Width: 0.5 ms
Lead Channel Pacing Threshold Pulse Width: 0.5 ms
Lead Channel Sensing Intrinsic Amplitude: 3.2 mV
Lead Channel Setting Pacing Amplitude: 2 V
Lead Channel Setting Pacing Amplitude: 2.5 V
Lead Channel Setting Pacing Pulse Width: 0.5 ms
Lead Channel Setting Sensing Sensitivity: 4 mV
Pulse Gen Model: 2272
Pulse Gen Serial Number: 5829992

## 2024-07-17 NOTE — Progress Notes (Signed)
 HPI Kim Moreno returns today for ongoing evaluation and management of her high grade heart block. She is a pleasant 78 yo woman with HTN, and remote breast CA s/p XRT who has known conduction system disease and presented with dizziness and 2:1 AV block and underwent St. Jude DDD PPM insertion in July 2024. She also had a documented 11 second pause. In the interim she notes she has felt well with no syncope. She remains active. She went to her 77 year high school class reunion and might a new boyfriend.  Allergies  Allergen Reactions   Arimidex [Anastrozole] Rash   Latex Rash     Current Outpatient Medications  Medication Sig Dispense Refill   Alpha-Lipoic Acid 100 MG TABS Take 100 mg by mouth daily. Pt takes 1/2 tab     amLODipine  (NORVASC ) 10 MG tablet Take 1 tablet (10 mg total) by mouth daily. 30 tablet 3   aspirin  81 MG chewable tablet Chew 81 mg by mouth daily.     Beta Carotene (VITAMIN A) 25000 UNIT capsule Take 7,000 Units by mouth daily.     Biotin 1000 MCG tablet 1 tablet Orally Once a day     Calcium -Phosphorus-Vitamin D (CITRACAL +D3) 250-107-500 MG-MG-UNIT CHEW as directed Orally     Co-Enzyme Q-10 100 MG CAPS Take 100 mg by mouth daily. Pt takes 1/2 tab     Copper Gluconate (COPPER CAPS PO) Take 5 mg by mouth daily. Pt takes 1/2 tab     Docosahexaenoic Acid (DHA PO) Take 300 mg by mouth daily. Pt takes 1/2 tab     ergocalciferol (DRISDOL) 200 MCG/ML drops Take 4,000 Units by mouth daily.     IRON-VITAMINS PO Take 6 mg by mouth.     LUTEIN PO Take 3 mg by mouth daily. Pt takes 1/2 tab     LYCOPENE PO Take 1 mg by mouth daily. Pt takes 1/2 tab     MAGNESIUM PO Take by mouth. Pt takes 1/2 tablet     Milk Thistle-Turmeric (SILYMARIN PO) Take 100 mg by mouth daily. Pt takes 1/2 tab     Misc Natural Products (GREEN FOOD COMPLEX PO) Take 2 capsules by mouth daily.     Multiple Minerals-Vitamins (CITRACAL MAXIMUM PLUS PO)      Multiple Vitamins-Minerals (ZINC PO) Take 15 mg  by mouth daily. Pt takes 1/2 tab     NIACIN PO Take 20 mg by mouth daily. Pt takes 1/2 tab     olmesartan  (BENICAR ) 20 MG tablet Take 2 tablets (40 mg total) by mouth daily.     omega-3 acid ethyl esters (LOVAZA) 1 g capsule Take 2 g by mouth daily.     Omega-3 Fatty Acids (EPA PO) Take by mouth.     OVER THE COUNTER MEDICATION biotin     PANTOTHENIC ACID PO Take 10 mg by mouth. Pt take 1/2 tab     Probiotic Product (PROBIOTIC PO) Take by mouth daily.     Quercetin 50 MG TABS Take 50 mg by mouth daily. Pt takes 1/2 tab     RESVERATROL PO Take 50 mg by mouth daily. Pt takes 1/2 tab     rosuvastatin  (CRESTOR ) 10 MG tablet Take 1 tablet (10 mg total) by mouth daily. 90 tablet 3   SELENIUM PO Take 70 mcg by mouth daily. Pt takes 1/2 tab     Thiamine HCl (THIAMINE PO) Take 3 mg by mouth daily. Pt takes 1/2 tab  No current facility-administered medications for this visit.     Past Medical History:  Diagnosis Date   Breast cancer [facility]) 2007   Personal history of radiation therapy 2008   left breast    ROS:   All systems reviewed and negative except as noted in the HPI.   Past Surgical History:  Procedure Laterality Date   BREAST LUMPECTOMY Left 2007   LEFT HEART CATH AND CORONARY ANGIOGRAPHY N/A 03/21/2023   Procedure: LEFT HEART CATH AND CORONARY ANGIOGRAPHY;  Surgeon: Wendel Lurena POUR, MD;  Location: [facility] INVASIVE CV LAB;  Service: Cardiovascular;  Laterality: N/A;   PACEMAKER IMPLANT N/A 03/25/2023   Procedure: PACEMAKER IMPLANT;  Surgeon: Waddell Danelle ORN, MD;  Location: [facility] INVASIVE CV LAB;  Service: Cardiovascular;  Laterality: N/A;   PARATHYROIDECTOMY     2000 approx     Family History  Problem Relation Age of Onset   Heart attack Brother      Social History   Socioeconomic History   Marital status: Divorced    Spouse name: Not on file   Number of children: Not on file   Years of education: Not on file   Highest education level: Not on file  Occupational History    Not on file  Tobacco Use   Smoking status: Former    Types: Cigarettes   Smokeless tobacco: Never  Substance and Sexual Activity   Alcohol use: Not on file   Drug use: Not on file   Sexual activity: Not on file  Other Topics Concern   Not on file  Social History Narrative   Not on file   Social Drivers of Health   Financial Resource Strain: Not on file  Food Insecurity: Not on file  Transportation Needs: Not on file  Physical Activity: Not on file  Stress: Not on file  Social Connections: Not on file  Intimate Partner Violence: Not on file     BP (!) 106/58   Pulse 68   Ht 5' 1 (1.549 m)   Wt 127 lb 3.2 oz (57.7 kg)   SpO2 95%   BMI 24.03 kg/m   Physical Exam:  Well appearing 78 yo woman,  NAD HEENT: Unremarkable Neck:  No JVD, no thyromegally Lymphatics:  No adenopathy Back:  No CVA tenderness Lungs:  Clear with no wheezes HEART:  Regular rate rhythm, no murmurs, no rubs, no clicks Abd:  soft, positive bowel sounds, no organomegally, no rebound, no guarding Ext:  2 plus pulses, no edema, no cyanosis, no clubbing Skin:  No rashes no nodules Neuro:  CN II through XII intact, motor grossly intact  EKG - P synch ventricular pacing  DEVICE  Normal device function.  See PaceArt for details.   Assess/Plan:  CHB - she is asymptomatic s/p DDD PM insertion. PPM - Her St. Jude DDD PM is working normally. She is pacing now over 99% of the time. HTN - Her bp is well controlled.   Danelle Tysheka Fanguy,MD

## 2024-07-17 NOTE — Patient Instructions (Signed)

## 2024-07-20 DIAGNOSIS — Z86018 Personal history of other benign neoplasm: Secondary | ICD-10-CM | POA: Diagnosis not present

## 2024-07-20 DIAGNOSIS — I10 Essential (primary) hypertension: Secondary | ICD-10-CM | POA: Diagnosis not present

## 2024-07-20 DIAGNOSIS — Z8679 Personal history of other diseases of the circulatory system: Secondary | ICD-10-CM | POA: Diagnosis not present

## 2024-07-22 DIAGNOSIS — C44319 Basal cell carcinoma of skin of other parts of face: Secondary | ICD-10-CM | POA: Diagnosis not present

## 2024-07-22 DIAGNOSIS — Z85828 Personal history of other malignant neoplasm of skin: Secondary | ICD-10-CM | POA: Diagnosis not present

## 2024-07-27 DIAGNOSIS — M810 Age-related osteoporosis without current pathological fracture: Secondary | ICD-10-CM | POA: Diagnosis not present

## 2024-07-31 ENCOUNTER — Ambulatory Visit (HOSPITAL_COMMUNITY)
Admission: RE | Admit: 2024-07-31 | Discharge: 2024-07-31 | Disposition: A | Discharge status: Home / Self Care | Payer: Medicare Other | Source: Ambulatory Visit | Attending: Internal Medicine | Admitting: Internal Medicine

## 2024-07-31 DIAGNOSIS — R011 Cardiac murmur, unspecified: Secondary | ICD-10-CM | POA: Diagnosis not present

## 2024-07-31 DIAGNOSIS — Z950 Presence of cardiac pacemaker: Secondary | ICD-10-CM

## 2024-07-31 LAB — ECHOCARDIOGRAM COMPLETE
Area-P 1/2: 2.82 cm2
MV VTI: 1.6 cm2
S' Lateral: 2.8 cm

## 2024-08-01 ENCOUNTER — Ambulatory Visit: Payer: Self-pay | Admitting: Internal Medicine

## 2024-08-06 DIAGNOSIS — Z792 Long term (current) use of antibiotics: Secondary | ICD-10-CM | POA: Diagnosis not present

## 2024-08-06 DIAGNOSIS — S91312A Laceration without foreign body, left foot, initial encounter: Secondary | ICD-10-CM | POA: Diagnosis not present

## 2024-08-11 ENCOUNTER — Encounter: Payer: Self-pay | Admitting: *Deleted

## 2024-08-12 ENCOUNTER — Ambulatory Visit: Attending: Cardiovascular Disease | Admitting: Internal Medicine

## 2024-08-12 ENCOUNTER — Encounter: Payer: Self-pay | Admitting: Internal Medicine

## 2024-08-12 VITALS — BP 124/62 | HR 74 | Ht 61.5 in | Wt 125.0 lb

## 2024-08-12 DIAGNOSIS — E785 Hyperlipidemia, unspecified: Secondary | ICD-10-CM | POA: Diagnosis not present

## 2024-08-12 DIAGNOSIS — I3481 Nonrheumatic mitral (valve) annulus calcification: Secondary | ICD-10-CM | POA: Diagnosis not present

## 2024-08-12 DIAGNOSIS — I2510 Atherosclerotic heart disease of native coronary artery without angina pectoris: Secondary | ICD-10-CM | POA: Diagnosis not present

## 2024-08-12 DIAGNOSIS — I442 Atrioventricular block, complete: Secondary | ICD-10-CM | POA: Diagnosis not present

## 2024-08-12 DIAGNOSIS — I342 Nonrheumatic mitral (valve) stenosis: Secondary | ICD-10-CM | POA: Diagnosis not present

## 2024-08-12 DIAGNOSIS — Z950 Presence of cardiac pacemaker: Secondary | ICD-10-CM | POA: Diagnosis not present

## 2024-08-12 DIAGNOSIS — I10 Essential (primary) hypertension: Secondary | ICD-10-CM | POA: Insufficient documentation

## 2024-08-12 DIAGNOSIS — R011 Cardiac murmur, unspecified: Secondary | ICD-10-CM | POA: Diagnosis not present

## 2024-08-12 NOTE — Patient Instructions (Signed)
 Medication Instructions:  No Changes  *If you need a refill on your cardiac medications before your next appointment, please call your pharmacy*  Lab Work: None  Follow-Up: At [facility], you and your health needs are our priority.  As part of our continuing mission to provide you with exceptional heart care, our providers are all part of one team.  This team includes your primary Cardiologist (physician) and Advanced Practice Providers or APPs (Physician Assistants and Nurse Practitioners) who all work together to provide you with the care you need, when you need it.  Your next appointment:   1 year(s) (We will mail a reminder letter around August 2026; please call for a November 2026 appointment)  Provider:   Gayatri A Acharya, MD   Other Instructions Please call us  or send a MyChart message with any Cardiology related questions/concerns.  [phone].  Thank you!

## 2024-08-12 NOTE — Progress Notes (Signed)
 Cardiology Office Note:  .   Date:  08/12/2024  ID:  Kim Moreno, DOB 1945-12-05, MRN [id number] PCP: Kim Pellet, MD  [facility] Providers Cardiologist:  Kim DELENA Merck, MD    History of Present Illness: .   Kim Moreno is a 78 y.o. female.  Discussed the use of AI scribe software for clinical note transcription with the patient, who gave verbal consent to proceed.  History of Present Illness Kim Moreno is a 78 year old female with high grade AV block who presents for follow-up after pacemaker placement.  She underwent a [employer] Jude dual chamber permanent pacemaker placement in July 2024 for high grade AV block. She feels well and indicates that the pacemaker is functioning appropriately. She had a follow-up with electrophysiologist last month.  Her most recent echocardiogram on July 31, 2024, showed normal ejection fraction, grossly normal right ventricular function, mild mitral valve regurgitation, moderate mitral stenosis with a mean gradient of 8 mmHg at a heart rate of 71 bpm, and severe mitral annular calcifications. The aortic valve appeared grossly normal.  She takes amlodipine  10 mg daily, olmesartan  40 mg daily, and aspirin  81 mg daily for hypertension. She is on rosuvastatin  10 mg daily for cholesterol management. Her LDL is currently 93 mg/dL, and her HDL is high. She follows a Mediterranean diet and reports no weight gain.  No significant shortness of breath, chest discomfort, or unusual symptoms. She experiences expected shortness of breath with exertion, such as running up and down stairs or raking the yard.    ROS: negative except per HPI above.  Studies Reviewed: .        Results LABS LDL: 93 (11/2023) Hemoglobin: Normal (11/2023) Creatinine: Normal (11/2023) Potassium: Normal (11/2023)  RADIOLOGY Calcium  Score: 231 (2022)  DIAGNOSTIC Echocardiogram: Normal ejection fraction, grossly normal right ventricular function, mild mitral  valve regurgitation, moderate mitral stenosis with mean gradient of 8 mmHg at heart rate 71 bpm, severe mitral annular calcifications, grossly normal aortic valve (07/31/2024) Risk Assessment/Calculations:       Physical Exam:   VS:  BP 124/62   Pulse 74   Ht 5' 1.5 (1.562 m)   Wt 125 lb (56.7 kg)   SpO2 91%   BMI 23.24 kg/m    Wt Readings from Last 3 Encounters:  08/12/24 125 lb (56.7 kg)  07/17/24 127 lb 3.2 oz (57.7 kg)  08/06/23 126 lb 9.6 oz (57.4 kg)     Physical Exam GENERAL: Alert, cooperative, well developed, no acute distress. HEENT: Normocephalic, normal oropharynx, moist mucous membranes. NECK: Neck artery auscultation reveals sound radiating from heart. CHEST: Clear to auscultation bilaterally, no wheezes, rhonchi, or crackles. CARDIOVASCULAR: Normal heart rate and rhythm, S1 and S2 normal mild systolic and diastolic murmurs ABDOMEN: Soft, non-tender, non-distended, without organomegaly, normal bowel sounds. EXTREMITIES: No cyanosis or edema. NEUROLOGICAL: Cranial nerves grossly intact, moves all extremities without gross motor or sensory deficit.   ASSESSMENT AND PLAN: .    Assessment and Plan Assessment & Plan Status post permanent pacemaker for high grade atrioventricular block Pacemaker functioning appropriately. Recent electrophysiologist follow-up was satisfactory. - Continue current pacemaker management.  Hypertension Well controlled with current medication regimen. Blood pressure within target range. - Continue current antihypertensive medications: amlodipine  10 mg daily and olmesartan  40 mg daily.  Hyperlipidemia Coronary artery calcifications, calcium  score 231 LDL cholesterol at 93 mg/dL, above target. Discussed options to lower LDL further. Patient prefers current regimen, reassess in six months. - Continue rosuvastatin  10  mg daily. - Reassess lipid levels in six months.  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 Severe mitral annular calcification likely due to past radiation therapy. No symptoms warranting intervention. Discussed potential future interventions if symptoms develop. - Monitor for symptoms of heart failure or increased shortness of breath. - Repeat echocardiogram in 1-2 years unless symptoms develop.  Constipation and diarrhea Alternating constipation and diarrhea, possibly related to irritable bowel syndrome. Discussed potential side effects of ezetimibe if initiated. - Monitor gastrointestinal symptoms.  Recording duration: 19 minutes      Caramia Boutin, MD, FACC

## 2024-08-16 DIAGNOSIS — Z4802 Encounter for removal of sutures: Secondary | ICD-10-CM | POA: Diagnosis not present

## 2024-09-02 ENCOUNTER — Other Ambulatory Visit: Payer: Self-pay | Admitting: Internal Medicine

## 2024-09-08 ENCOUNTER — Other Ambulatory Visit: Payer: Self-pay | Admitting: Internal Medicine

## 2024-09-10 MED ORDER — ROSUVASTATIN CALCIUM 10 MG PO TABS: 10.0000 mg | ORAL_TABLET | Freq: Every day | ORAL | 3 refills | Status: AC

## 2024-09-22 ENCOUNTER — Ambulatory Visit: Payer: Medicare Other

## 2024-09-22 DIAGNOSIS — I442 Atrioventricular block, complete: Secondary | ICD-10-CM | POA: Diagnosis not present

## 2024-09-25 ENCOUNTER — Ambulatory Visit: Payer: Self-pay | Admitting: Internal Medicine

## 2024-09-25 LAB — CUP PACEART REMOTE DEVICE CHECK
Battery Remaining Longevity: 90 mo
Battery Remaining Percentage: 87 %
Battery Voltage: 2.99 V
Brady Statistic AP VP Percent: 13 %
Brady Statistic AP VS Percent: 1 %
Brady Statistic AS VP Percent: 87 %
Brady Statistic AS VS Percent: 1 %
Brady Statistic RA Percent Paced: 13 %
Brady Statistic RV Percent Paced: 99 %
Date Time Interrogation Session: 20251230020013
Implantable Lead Connection Status: 753985
Implantable Lead Connection Status: 753985
Implantable Lead Implant Date: 20240701
Implantable Lead Implant Date: 20240701
Implantable Lead Location: 753859
Implantable Lead Location: 753860
Implantable Pulse Generator Implant Date: 20240701
Lead Channel Impedance Value: 440 Ohm
Lead Channel Impedance Value: 480 Ohm
Lead Channel Pacing Threshold Amplitude: 0.75 V
Lead Channel Pacing Threshold Amplitude: 0.75 V
Lead Channel Pacing Threshold Pulse Width: 0.5 ms
Lead Channel Pacing Threshold Pulse Width: 0.5 ms
Lead Channel Sensing Intrinsic Amplitude: 10.3 mV
Lead Channel Sensing Intrinsic Amplitude: 5 mV
Lead Channel Setting Pacing Amplitude: 2 V
Lead Channel Setting Pacing Amplitude: 2.5 V
Lead Channel Setting Pacing Pulse Width: 0.5 ms
Lead Channel Setting Sensing Sensitivity: 4 mV
Pulse Gen Model: 2272
Pulse Gen Serial Number: 5829992

## 2024-09-28 NOTE — Progress Notes (Deleted)
 Remote PPM Transmission

## 2024-09-29 NOTE — Progress Notes (Signed)
 Remote PPM Transmission
